# Patient Record
Sex: Female | Born: 1985 | Race: Black or African American | Hispanic: No | Marital: Married | Smoking: Never smoker
Health system: Southern US, Community
[De-identification: ages and names within clinical notes are randomized; demographics above are authoritative.]

## PROBLEM LIST (undated history)

## (undated) ENCOUNTER — Inpatient Hospital Stay (HOSPITAL_COMMUNITY): Payer: Self-pay

## (undated) DIAGNOSIS — Z789 Other specified health status: Secondary | ICD-10-CM

## (undated) HISTORY — PX: NO PAST SURGERIES: SHX2092

---

## 2017-12-23 ENCOUNTER — Encounter (HOSPITAL_COMMUNITY): Payer: Self-pay

## 2017-12-23 ENCOUNTER — Inpatient Hospital Stay (HOSPITAL_COMMUNITY)
Admission: AD | Admit: 2017-12-23 | Discharge: 2017-12-23 | Disposition: A | Discharge status: Home / Self Care | Payer: Medicaid Other | Attending: Obstetrics & Gynecology | Admitting: Obstetrics & Gynecology

## 2017-12-23 DIAGNOSIS — R109 Unspecified abdominal pain: Secondary | ICD-10-CM

## 2017-12-23 DIAGNOSIS — O2342 Unspecified infection of urinary tract in pregnancy, second trimester: Secondary | ICD-10-CM | POA: Diagnosis not present

## 2017-12-23 DIAGNOSIS — O0932 Supervision of pregnancy with insufficient antenatal care, second trimester: Secondary | ICD-10-CM

## 2017-12-23 DIAGNOSIS — R51 Headache: Secondary | ICD-10-CM

## 2017-12-23 DIAGNOSIS — Z3A22 22 weeks gestation of pregnancy: Secondary | ICD-10-CM

## 2017-12-23 DIAGNOSIS — R1030 Lower abdominal pain, unspecified: Secondary | ICD-10-CM | POA: Diagnosis present

## 2017-12-23 DIAGNOSIS — O26892 Other specified pregnancy related conditions, second trimester: Secondary | ICD-10-CM | POA: Diagnosis not present

## 2017-12-23 HISTORY — DX: Other specified health status: Z78.9

## 2017-12-23 LAB — WET PREP, GENITAL
Clue Cells Wet Prep HPF POC: NONE SEEN
Sperm: NONE SEEN
Trich, Wet Prep: NONE SEEN
Yeast Wet Prep HPF POC: NONE SEEN

## 2017-12-23 LAB — URINALYSIS, ROUTINE W REFLEX MICROSCOPIC
BILIRUBIN URINE: NEGATIVE
Bacteria, UA: NONE SEEN
Glucose, UA: NEGATIVE mg/dL
Hgb urine dipstick: NEGATIVE
Ketones, ur: NEGATIVE mg/dL
NITRITE: NEGATIVE
Protein, ur: NEGATIVE mg/dL
Specific Gravity, Urine: 1.013 (ref 1.005–1.030)
pH: 7 (ref 5.0–8.0)

## 2017-12-23 MED ORDER — CONCEPT OB 130-92.4-1 MG PO CAPS: 1.0000 | ORAL_CAPSULE | Freq: Every day | ORAL | 12 refills | Status: DC

## 2017-12-23 MED ORDER — IBUPROFEN 600 MG PO TABS
600.0000 mg | ORAL_TABLET | Freq: Once | ORAL | Status: AC
  Administered 2017-12-23: 600 mg via ORAL
  Filled 2017-12-23: qty 1

## 2017-12-23 MED ORDER — IBUPROFEN 600 MG PO TABS: 600.0000 mg | ORAL_TABLET | Freq: Four times a day (QID) | ORAL | 0 refills | Status: DC | PRN

## 2017-12-23 MED ORDER — NITROFURANTOIN MONOHYD MACRO 100 MG PO CAPS: 100.0000 mg | ORAL_CAPSULE | Freq: Two times a day (BID) | ORAL | 0 refills | Status: DC

## 2017-12-23 MED ORDER — NITROFURANTOIN MONOHYD MACRO 100 MG PO CAPS
100.0000 mg | ORAL_CAPSULE | Freq: Once | ORAL | Status: AC
  Administered 2017-12-23: 100 mg via ORAL
  Filled 2017-12-23: qty 1

## 2017-12-23 NOTE — MAU Note (Addendum)
Lower abdominal pain since ongoing for 3 weeks, intermittent, more when walking and standing, states it is a pressure, 7/10  No bleeding, no discharge today, no LOF  [facility] from [place]

## 2017-12-23 NOTE — Discharge Instructions (Signed)
Pregnancy and Urinary Tract Infection What is a urinary tract infection?  A urinary tract infection (UTI) is an infection of any part of the urinary tract. This includes the kidneys, the tubes that connect your kidneys to your bladder (ureters), the bladder, and the tube that carries urine out of your body (urethra). These organs make, store, and get rid of urine in the body.  An upper UTI affects the ureters and kidneys (pyelonephritis), and a lower UTI affects the bladder (cystitis) and urethra (urethritis). Most urinary tract infections are caused by bacteria in your genital area, around the entrance to your urinary tract (urethra). These bacteria grow and cause irritation and inflammation of your urinary tract. Why am I more likely to get a UTI during pregnancy? You are more likely to develop a UTI during pregnancy because:  The physical and hormonal changes your body goes through can make it easier for bacteria to get into your urinary tract.  Your growing baby puts pressure on your uterus and can affect urine flow. Does a UTI place my baby at risk? An untreated UTI during pregnancy could lead to a kidney infection, which can cause health problems that could affect your baby. Possible complications of an untreated UTI include:  Having your baby before 37 weeks of pregnancy (premature).  Having a baby with a low birth weight.  Developing high blood pressure during pregnancy (preeclampsia).  Having a low hemoglobin level (anemia). What are the symptoms of a UTI? Symptoms of a UTI include:  Needing to urinate right away (urgently).  Frequent urination or passing small amounts of urine frequently.  Pain or burning with urination.  Blood in the urine.  Urine that smells bad or unusual.  Trouble urinating.  Cloudy urine.  Pain in the abdomen or lower back.  Vaginal discharge. You may also have:  Vomiting or a decreased appetite.  Confusion.  Irritability or  tiredness.  A fever.  Diarrhea. What are the treatment options for a UTI during pregnancy? Treatment for this condition may include:  Antibiotic medicines that are safe to take during pregnancy.  Other medicines to treat less common causes of UTI. How can I prevent a UTI? To prevent a UTI:  Go to the bathroom as soon as you feel the need. Do not hold urine for long periods of time.  Always wipe from front to back after a bowel movement. Use each tissue one time when you wipe.  Empty your bladder after sex.  Keep your genital area dry.  Drink 6-10 glasses of water each day.  Do not douche or use deodorant sprays. Contact a health care provider if:  Your symptoms do not improve or they get worse.  You have abnormal vaginal discharge. Get help right away if:  You have a fever.  You have nausea and vomiting.  You have back or side pain.  You feel contractions in your uterus.  You have lower belly pain.  You have a gush of fluid from your vagina.  You have blood in your urine. Summary  A urinary tract infection (UTI) is an infection of any part of the urinary tract, which includes the kidneys, ureters, bladder, and urethra.  Most urinary tract infections are caused by bacteria in your genital area, around the entrance to your urinary tract (urethra).  You are more likely to develop a UTI during pregnancy.  If you were prescribed an antibiotic, take it as told by your health care provider. Do not stop taking the  antibiotic even if you start to feel better. °This information is not intended to replace advice given to you by your health care provider. Make sure you discuss any questions you have with your health care provider. °Document Released: 04/15/2010 Document Revised: 02/13/2017 Document Reviewed: 11/09/2014 °Elsevier Interactive Patient Education © 2019 Elsevier Inc. ° °Round Ligament Pain ° °The round ligament is a cord of muscle and tissue that helps support the  uterus. It can become a source of pain during pregnancy if it becomes stretched or twisted as the baby grows. The pain usually begins in the second trimester (13-28 weeks) of pregnancy, and it can come and go until the baby is delivered. It is not a serious problem, and it does not cause harm to the baby. °Round ligament pain is usually a short, sharp, and pinching pain, but it can also be a dull, lingering, and aching pain. The pain is felt in the lower side of the abdomen or in the groin. It usually starts deep in the groin and moves up to the outside of the hip area. The pain may occur when you: °· Suddenly change position, such as quickly going from a sitting to standing position. °· Roll over in bed. °· Cough or sneeze. °· Do physical activity. °Follow these instructions at home: ° °· Watch your condition for any changes. °· When the pain starts, relax. Then try any of these methods to help with the pain: °? Sitting down. °? Flexing your knees up to your abdomen. °? Lying on your side with one pillow under your abdomen and another pillow between your legs. °? Sitting in a warm bath for 15-20 minutes or until the pain goes away. °· Take over-the-counter and prescription medicines only as told by your health care provider. °· Move slowly when you sit down or stand up. °· Avoid long walks if they cause pain. °· Stop or reduce your physical activities if they cause pain. °· Keep all follow-up visits as told by your health care provider. This is important. °Contact a health care provider if: °· Your pain does not go away with treatment. °· You feel pain in your back that you did not have before. °· Your medicine is not helping. °Get help right away if: °· You have a fever or chills. °· You develop uterine contractions. °· You have vaginal bleeding. °· You have nausea or vomiting. °· You have diarrhea. °· You have pain when you urinate. °Summary °· Round ligament pain is felt in the lower abdomen or groin. It is  usually a short, sharp, and pinching pain. It can also be a dull, lingering, and aching pain. °· This pain usually begins in the second trimester (13-28 weeks). It occurs because the uterus is stretching with the growing baby, and it is not harmful to the baby. °· You may notice the pain when you suddenly change position, when you cough or sneeze, or during physical activity. °· Relaxing, flexing your knees to your abdomen, lying on one side, or taking a warm bath may help to get rid of the pain. °· Get help from your health care provider if the pain does not go away or if you have vaginal bleeding, nausea, vomiting, diarrhea, or painful urination. °This information is not intended to replace advice given to you by your health care provider. Make sure you discuss any questions you have with your health care provider. °Document Released: 09/28/2007 Document Revised: 06/06/2017 Document Reviewed: 06/06/2017 °Elsevier Interactive Patient Education ©   Education  2019 [employer].

## 2017-12-23 NOTE — MAU Provider Note (Signed)
Chief Complaint:  Abdominal Pain   First Provider Initiated Contact with Patient 12/23/17 1142     HPI: Amanda George is a 32 y.o. G3P2002 at [id number] who presents to maternity admissions reporting bilateral, sharp low abdominal pain x2-3 weeks that is worse upon standing or changing positions.  Also reports headache.  Has not tried anything for the symptoms.  Has not started prenatal care.  Moved from [place] and did not have prenatal care there.  Location: Bilateral low abdomen Quality: Sharp Severity: 7/10 in pain scale Duration: 2-3 weeks Context: [redacted] weeks gestation Timing: Intermittent Modifying factors: Worse with position changes Associated signs and symptoms: Negative for fever, chills, leaking of fluid, vaginal discharge, vaginal bleeding, GI complaints.  Positive for dysuria, urgency, frequency.  Negative for hematuria or flank pain. Good fetal movement.    Past Medical History:  Diagnosis Date  . Medical history non-contributory    OB History  Gravida Para Term Preterm AB Living  3 2 2     2   SAB TAB Ectopic Multiple Live Births               # Outcome Date GA Lbr Len/2nd Weight Sex Delivery Anes PTL Lv  3 Current           2 Term      Vag-Spont     1 Term      Vag-Spont      Past Surgical History:  Procedure Laterality Date  . NO PAST SURGERIES     History reviewed. No pertinent family history. Social History   Tobacco Use  . Smoking status: Never Smoker  . Smokeless tobacco: Never Used  Substance Use Topics  . Alcohol use: Not Currently  . Drug use: Never   No Known Allergies No medications prior to admission.    I have reviewed patient's Past Medical Hx, Surgical Hx, Family Hx, Social Hx, medications and allergies.   ROS:  Review of Systems  Constitutional: Negative for chills and fever.  Gastrointestinal: Positive for abdominal pain. Negative for abdominal distention, constipation, diarrhea, nausea and vomiting.  Genitourinary: Positive for dysuria,  frequency and urgency. Negative for flank pain, hematuria, vaginal bleeding and vaginal discharge.  Musculoskeletal: Negative for back pain.    Physical Exam   Patient Vitals for the past 24 hrs:  BP Temp Temp src Pulse Resp Weight  12/23/17 1113 117/70 98.4 F (36.9 C) Oral 99 18 56.5 kg   Constitutional: Well-developed, well-nourished female in no acute distress.  Cardiovascular: normal rate Respiratory: normal effort GI: Abd soft, non-tender, gravid appropriate for gestational age. Pos BS x 4 MS: Extremities nontender, no edema, normal ROM Neurologic: Alert and oriented x 4.  GU: Neg CVAT.  Pelvic: NEFG, physiologic discharge, no blood, cervix clean. No CMT  Dilation: Closed Effacement (%): Thick Cervical Position: Middle Station: Ballotable Presentation: Undeterminable Exam by:: Dorathy KinsmanVirginia Sapna Padron, CNM  FHT: 165 by Doppler   Labs: Results for orders placed or performed during the hospital encounter of 12/23/17 (from the past 24 hour(s))  Urinalysis, Routine w reflex microscopic     Status: Abnormal   Collection Time: 12/23/17 11:21 AM  Result Value Ref Range   Color, Urine YELLOW YELLOW   APPearance CLEAR CLEAR   Specific Gravity, Urine 1.013 1.005 - 1.030   pH 7.0 5.0 - 8.0   Glucose, UA NEGATIVE NEGATIVE mg/dL   Hgb urine dipstick NEGATIVE NEGATIVE   Bilirubin Urine NEGATIVE NEGATIVE   Ketones, ur NEGATIVE NEGATIVE mg/dL   Protein,  ur NEGATIVE NEGATIVE mg/dL   Nitrite NEGATIVE NEGATIVE   Leukocytes, UA MODERATE (A) NEGATIVE   RBC / HPF 0-5 0 - 5 RBC/hpf   WBC, UA 6-10 0 - 5 WBC/hpf   Bacteria, UA NONE SEEN NONE SEEN   Squamous Epithelial / LPF 6-10 0 - 5   Mucus PRESENT   Wet prep, genital     Status: Abnormal   Collection Time: 12/23/17 12:12 PM  Result Value Ref Range   Yeast Wet Prep HPF POC NONE SEEN NONE SEEN   Trich, Wet Prep NONE SEEN NONE SEEN   Clue Cells Wet Prep HPF POC NONE SEEN NONE SEEN   WBC, Wet Prep HPF POC MODERATE (A) NONE SEEN   Sperm NONE  SEEN     Imaging:  NA  MAU Course: Orders Placed This Encounter  Procedures  . Wet prep, genital  . Culture, OB Urine  . US MFM OB COMP + 14 WK  . Urinalysis, Routine w reflex microscopic  . Lab instructions  . Discharge patient   Meds ordered this encounter  Medications  . ibuprofen (ADVIL,MOTRIN) tablet 600 mg  . nitrofurantoin (macrocrystal-monohydrate) (MACROBID) capsule 100 mg  . Prenat w/o A Vit-FeFum-FePo-FA (CONCEPT OB) 130-92.4-1 MG CAPS    Sig: Take 1 tablet by mouth daily.    Dispense:  30 capsule    Refill:  12    Order Specific Question:   Supervising Provider    Answer:   Jaynie CollinsANYANWU, UGONNA A [[id number]]  . nitrofurantoin, macrocrystal-monohydrate, (MACROBID) 100 MG capsule    Sig: Take 1 capsule (100 mg total) by mouth 2 (two) times daily.    Dispense:  14 capsule    Refill:  0    Order Specific Question:   Supervising Provider    Answer:   Jaynie CollinsANYANWU, UGONNA A [[id number]]  . ibuprofen (ADVIL,MOTRIN) 600 MG tablet    Sig: Take 1 tablet (600 mg total) by mouth every 6 (six) hours as needed for moderate pain. Use sparingly.  Do not use after [redacted] weeks gestation.    Dispense:  30 tablet    Refill:  0    Order Specific Question:   Supervising Provider    Answer:   Jaynie CollinsANYANWU, UGONNA A [[id number]]    MDM: -Low abdominal pain likely due to UTI and round ligament pain.  No evidence of active preterm labor or other emergent condition.  Rx ibuprofen and Macrobid.  Instructed to use ibuprofen sparingly and not use after [redacted] weeks gestation. -No prenatal care.  [facility]  In basket message sent.  Anatomy ultrasound ordered.  Assessment: 1. UTI (urinary tract infection) during pregnancy, second trimester   2. Abdominal pain during pregnancy in second trimester   3. No prenatal care in current pregnancy in second trimester   4. Headache in pregnancy, antepartum, second trimester     Plan: Discharge home in stable condition.   Preterm Labor precautions and fetal kick counts GC/Chlamydia pending Follow-up Information    [facility] Follow up.   Specialty:  Radiology Why:  Will call you to schedule ultrasound Contact information: [address] [id number] mc [place] [id number] [phone]       [facility].   Specialty:  Obstetrics and Gynecology Why:  Call you to schedule appointment.  Please call if you have not received a call in 1 week. Contact information: [address] [address] [phone]       [facility] Follow up.   Specialty:  Obstetrics and Gynecology Why:  As needed in pregnancy emergencies Contact information: [address] [id number] mc [place] [address] [phone]          Allergies as of 12/23/2017   No Known Allergies     Medication List    TAKE these medications   CONCEPT OB 130-92.4-1 MG Caps Take 1 tablet by mouth daily.   ibuprofen 600 MG tablet Commonly known as:  ADVIL,MOTRIN Take 1 tablet (600 mg total) by mouth every 6 (six) hours as needed for moderate pain. Use sparingly.  Do not use after [redacted] weeks gestation.   nitrofurantoin (macrocrystal-monohydrate) 100 MG capsule Commonly known as:  MACROBID Take 1 capsule (100 mg total) by mouth 2 (two) times daily.       Katrinka Blazing, [place], CNM 12/23/2017 1:02 PM

## 2017-12-24 LAB — CULTURE, OB URINE: Culture: 10000 — AB

## 2017-12-24 LAB — GC/CHLAMYDIA PROBE AMP (~~LOC~~) NOT AT ARMC
Chlamydia: NEGATIVE
Neisseria Gonorrhea: NEGATIVE

## 2018-01-02 NOTE — L&D Delivery Note (Signed)
Delivery Note Amanda George progressed spontaneously to completely dilated and crowning at 1300 with a BBOW present at introitus which was easily ruptured manually for thin MSF. She pushed with the next couple of contractions and at 1:08 PM a viable female was delivered via Vaginal, Spontaneous (Presentation: ROA). Cord was looped next to the neck, but not around it.  APGAR: 9, 9; weight: 2940gm.   Placenta status: spont ,intact .  Cord: 3 vessel  Anesthesia:  Epidural Episiotomy: Type III infibulation; hood cut upwards with delivery to facilitate room for head while attempting to avoid perineal trauma;  Lacerations: None Suture Repair: 4.0 monocryl on an SH needle- repair of infibulation to previous status per pt request Est. Blood Loss (mL): 56  Mom to postpartum.  Baby to Couplet care / Skin to Skin.  [name] CNM 04/12/2018, 1:38 PM  Please schedule this patient for Postpartum visit in: 4 weeks with the following provider: Any provider For C/S patients schedule nurse incision check in weeks 2 weeks: no Low risk pregnancy complicated by: none Delivery mode:  SVD Anticipated Birth Control:  IUD PP Procedures needed: none  Schedule Integrated BH visit: no

## 2018-02-01 ENCOUNTER — Encounter: Payer: Self-pay | Admitting: *Deleted

## 2018-02-01 ENCOUNTER — Other Ambulatory Visit: Payer: Self-pay

## 2018-02-01 ENCOUNTER — Ambulatory Visit: Payer: Medicaid Other | Admitting: Clinical

## 2018-02-01 ENCOUNTER — Ambulatory Visit (INDEPENDENT_AMBULATORY_CARE_PROVIDER_SITE_OTHER): Payer: Medicaid Other | Admitting: *Deleted

## 2018-02-01 VITALS — BP 103/57 | HR 93 | Wt 128.2 lb

## 2018-02-01 DIAGNOSIS — Z3490 Encounter for supervision of normal pregnancy, unspecified, unspecified trimester: Secondary | ICD-10-CM | POA: Insufficient documentation

## 2018-02-01 DIAGNOSIS — O0930 Supervision of pregnancy with insufficient antenatal care, unspecified trimester: Secondary | ICD-10-CM

## 2018-02-01 DIAGNOSIS — Z789 Other specified health status: Secondary | ICD-10-CM

## 2018-02-01 NOTE — BH Specialist Note (Signed)
Integrated Behavioral Health Initial Visit  MRN: [id number] Name: Aveona Greenlief  Number of Integrated Behavioral Health Clinician visits:: 1/6 Session Start time: 10:14  Session End time: 10:24 Total time: 15 minutes  Type of Service: Integrated Behavioral Health- Individual/Family Interpretor:Yes.   Interpretor Name and Language: Arabic   Warm Hand Off Completed.       SUBJECTIVE: Noeline Pontrelli is a 33 y.o. female accompanied by n/a Patient was referred by [place] Muta Day, RN for Initial OB introduction to integrated behavioral health services . Patient reports the following symptoms/concerns: Pt states her only concern today is being unfamiliar with the new [facility] Duration of problem: Current pregnancy; Severity of problem: mild  OBJECTIVE: Mood: Anxious and Affect: Appropriate Risk of harm to self or others: No plan to harm self or others  LIFE CONTEXT: Family and Social: - School/Work: - Self-Care: - Life Changes: Current pregnancy  GOALS ADDRESSED: Patient will: 1. Reduce symptoms of: stress, related to upcoming hospital move  INTERVENTIONS: Interventions utilized: Supportive Counseling  Standardized Assessments completed: not given today  ASSESSMENT: Patient currently experiencing Supervision of low-risk pregnancy, antepartum   Patient may benefit from Initial OB introduction to integrated behavioral health services .  PLAN: 1. Follow up with behavioral health clinician on : As needed 2. Behavioral recommendations:  -Share new hospital address with husband; become familiar with new hospital location -Continue taking prenatal vitamin, as recommended by medical provider 3. Referral(s): Integrated [employer] (In Clinic) 4. "From scale of 1-10, how likely are you to follow plan?": 10  Avriana Joo C Carissa Musick, LCSW

## 2018-02-01 NOTE — Progress Notes (Signed)
Video interpreters Noha [id number] and Christen Bame [phone] used for encounter. Multiple issues with [employer] system  were encountered during pt's interview which caused several delays with completing the visit. New Ob intake completed and pregnancy information packet given. Labs drawn, [place] ordered and scheduled. Pt reports having intermittent abdominal pain x4 days which feels like labor. She denies having any pain today. Pt also states that she has noticed her lips are swollen, dry and cracked for the past 2 days. She has been using vaseline without improvement. She denies food allergies and has not eaten anything out of the ordinary. Pt was advised of when she should come to hospital for possible labor symptoms. She was also advised to stay well hydrated and she may try Blistex ointment to lips. New Ob provider appointment is scheduled on 02/15/18.

## 2018-02-04 ENCOUNTER — Encounter (HOSPITAL_COMMUNITY): Payer: Self-pay

## 2018-02-11 ENCOUNTER — Ambulatory Visit (HOSPITAL_COMMUNITY)
Admission: RE | Admit: 2018-02-11 | Discharge: 2018-02-11 | Disposition: A | Discharge status: Home / Self Care | Payer: Medicaid Other | Source: Ambulatory Visit | Attending: Advanced Practice Midwife | Admitting: Advanced Practice Midwife

## 2018-02-11 DIAGNOSIS — O26892 Other specified pregnancy related conditions, second trimester: Secondary | ICD-10-CM | POA: Insufficient documentation

## 2018-02-11 DIAGNOSIS — O0933 Supervision of pregnancy with insufficient antenatal care, third trimester: Secondary | ICD-10-CM | POA: Diagnosis not present

## 2018-02-11 DIAGNOSIS — R109 Unspecified abdominal pain: Secondary | ICD-10-CM | POA: Insufficient documentation

## 2018-02-11 DIAGNOSIS — O0932 Supervision of pregnancy with insufficient antenatal care, second trimester: Secondary | ICD-10-CM | POA: Diagnosis not present

## 2018-02-11 DIAGNOSIS — R51 Headache: Secondary | ICD-10-CM | POA: Diagnosis not present

## 2018-02-11 DIAGNOSIS — O2342 Unspecified infection of urinary tract in pregnancy, second trimester: Secondary | ICD-10-CM | POA: Diagnosis not present

## 2018-02-11 DIAGNOSIS — Z3A30 30 weeks gestation of pregnancy: Secondary | ICD-10-CM | POA: Diagnosis not present

## 2018-02-11 DIAGNOSIS — Z363 Encounter for antenatal screening for malformations: Secondary | ICD-10-CM

## 2018-02-12 LAB — OBSTETRIC PANEL, INCLUDING HIV
Antibody Screen: NEGATIVE
BASOS ABS: 0.1 10*3/uL (ref 0.0–0.2)
Basos: 1 %
EOS (ABSOLUTE): 0.2 10*3/uL (ref 0.0–0.4)
Eos: 3 %
HEMOGLOBIN: 10.2 g/dL — AB (ref 11.1–15.9)
HIV Screen 4th Generation wRfx: NONREACTIVE
Hematocrit: 30.1 % — ABNORMAL LOW (ref 34.0–46.6)
Hepatitis B Surface Ag: NEGATIVE
IMMATURE GRANULOCYTES: 0 %
Immature Grans (Abs): 0 10*3/uL (ref 0.0–0.1)
Lymphocytes Absolute: 1.5 10*3/uL (ref 0.7–3.1)
Lymphs: 29 %
MCH: 31.5 pg (ref 26.6–33.0)
MCHC: 33.9 g/dL (ref 31.5–35.7)
MCV: 93 fL (ref 79–97)
MONOCYTES: 6 %
Monocytes Absolute: 0.3 10*3/uL (ref 0.1–0.9)
Neutrophils Absolute: 3.2 10*3/uL (ref 1.4–7.0)
Neutrophils: 61 %
PLATELETS: 119 10*3/uL — AB (ref 150–450)
RBC: 3.24 x10E6/uL — AB (ref 3.77–5.28)
RDW: 12.4 % (ref 11.7–15.4)
RPR Ser Ql: NONREACTIVE
Rh Factor: POSITIVE
Rubella Antibodies, IGG: 7.67 index (ref >0.99)
WBC: 5.3 10*3/uL (ref 3.4–10.8)

## 2018-02-12 LAB — INHERITEST(R) CF/SMA PANEL

## 2018-02-12 LAB — HEMOGLOBINOPATHY EVALUATION
Ferritin: 21 ng/mL (ref 15–150)
HGB SOLUBILITY: NEGATIVE
Hgb A2 Quant: 2.4 % (ref 1.8–3.2)
Hgb A: 97.6 % (ref 96.4–98.8)
Hgb C: 0 %
Hgb F Quant: 0 % (ref 0.0–2.0)
Hgb S: 0 %
Hgb Variant: 0 %

## 2018-02-15 ENCOUNTER — Ambulatory Visit (INDEPENDENT_AMBULATORY_CARE_PROVIDER_SITE_OTHER): Payer: Medicaid Other | Admitting: Family Medicine

## 2018-02-15 ENCOUNTER — Encounter: Payer: Self-pay | Admitting: Family Medicine

## 2018-02-15 ENCOUNTER — Other Ambulatory Visit (HOSPITAL_COMMUNITY)
Admission: RE | Admit: 2018-02-15 | Discharge: 2018-02-15 | Disposition: A | Discharge status: Home / Self Care | Payer: Medicaid Other | Source: Ambulatory Visit | Attending: Family Medicine | Admitting: Family Medicine

## 2018-02-15 VITALS — BP 104/61 | HR 92 | Wt 129.5 lb

## 2018-02-15 DIAGNOSIS — Z3A30 30 weeks gestation of pregnancy: Secondary | ICD-10-CM | POA: Diagnosis not present

## 2018-02-15 DIAGNOSIS — Z789 Other specified health status: Secondary | ICD-10-CM

## 2018-02-15 DIAGNOSIS — Z23 Encounter for immunization: Secondary | ICD-10-CM | POA: Diagnosis not present

## 2018-02-15 DIAGNOSIS — Z3493 Encounter for supervision of normal pregnancy, unspecified, third trimester: Secondary | ICD-10-CM | POA: Diagnosis not present

## 2018-02-15 DIAGNOSIS — N90810 Female genital mutilation status, unspecified: Secondary | ICD-10-CM | POA: Insufficient documentation

## 2018-02-15 NOTE — Progress Notes (Signed)
*In-person interpreter used for visit* Subjective:   Sanskriti Kadel is a 33 y.o. G3P2002 at [redacted]w[redacted]d by LMP being seen today for her first obstetrical visit.  Her obstetrical history is significant for late prenatal care. Patient does intend to breast feed. Pregnancy history fully reviewed.  - prior children full-term, both vaginal, born at home - Pap due, doesn't think she has ever had one - glucose testing, had in other pregnancies and never abnormal   Patient reports no complaints.  HISTORY: OB History  Gravida Para Term Preterm AB Living  3 2 2  0 0 2  SAB TAB Ectopic Multiple Live Births  0 0 0 0 2    # Outcome Date GA Lbr Len/2nd Weight Sex Delivery Anes PTL Lv  3 Current           2 Term 04/15/15 [redacted]w[redacted]d   F Vag-Spont  N LIV     Birth Comments: Nuchal and body cord - delayed breathing and crying  1 Term 03/23/12 [redacted]w[redacted]d   M Vag-Spont  N LIV     Birth Comments: Nuchal cord @ birth, delayed breathing and crying     Name: Shirline Frees     Past Medical History:  Diagnosis Date  . Medical history non-contributory    Past Surgical History:  Procedure Laterality Date  . NO PAST SURGERIES     Family History  Problem Relation Age of Onset  . Healthy Mother   . Healthy Father    Social History   Tobacco Use  . Smoking status: Never Smoker  . Smokeless tobacco: Never Used  Substance Use Topics  . Alcohol use: Not Currently  . Drug use: Never   No Known Allergies Current Outpatient Medications on File Prior to Visit  Medication Sig Dispense Refill  . Prenat w/o A Vit-FeFum-FePo-FA (CONCEPT OB) 130-92.4-1 MG CAPS Take 1 tablet by mouth daily. 30 capsule 12   No current facility-administered medications on file prior to visit.     Exam   Vitals:   02/15/18 0840  BP: 104/61  Pulse: 92  Weight: 58.7 kg   Fetal Heart Rate (bpm): 147  Uterus:     Pelvic Exam: Perineum: no hemorrhoids, normal perineum   Vulva: No lesions, partial fusion of external labia anteriorly   Vagina:  normal mucosa, normal discharge   Cervix: no lesions and normal, pap smear done.    Adnexa: normal adnexa and no mass, fullness, tenderness   Bony Pelvis: average  System: General: well-developed, well-nourished female in no acute distress   Skin: normal coloration and turgor, no rashes   Neurologic: oriented, normal, negative, normal mood   Extremities: normal strength, tone, and muscle mass, ROM of all joints is normal   HEENT PERRL, extraocular movement intact and sclera clear, anicteric   Mouth/Teeth mucous membranes moist, pharynx normal without lesions and dental hygiene good   Neck supple and no masses   Cardiovascular: regular rate and rhythm   Respiratory:  no respiratory distress, normal breath sounds   Abdomen: soft, non-tender; bowel sounds normal; no masses,  no organomegaly     Assessment:   Pregnancy: [id number] Patient Active Problem List   Diagnosis Date Noted  . 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 Supervision of low-risk pregnancy 02/01/2018  . Late prenatal care 02/01/2018  . Language barrier 02/01/2018     Plan:  33yo [id number] at [redacted]w[redacted]d by LMP who presents for first prenatal visit.   Initial Prenatal Visit -- labs reviewed (drawn at  nurse intake) -- anatomy U/S reviewed (wnl) - will follow-up again in 4 weeks for interval growth and to better visualize some anatomic landmarks -- will return next week for 2-hr GTT -- continue prenatal vitamins  -- flu and Tdap vaccines to be given today  -- Problem list reviewed and updated. Pregnancy history reviewed.  -- Pap smear collected  -- The nature of [facility] with multiple MDs and other Advanced Practice Providers was explained to patient; also emphasized that residents, students are part of our team. -- Routine obstetric precautions reviewed.  Follow-Up: 2 weeks  Cyntha Brickman S. Earlene Plater, DO OB/GYN Fellow

## 2018-02-18 DIAGNOSIS — Z3492 Encounter for supervision of normal pregnancy, unspecified, second trimester: Secondary | ICD-10-CM

## 2018-02-18 LAB — GLUCOSE, POCT (MANUAL RESULT ENTRY): POC Glucose: 83 mg/dl (ref 70–99)

## 2018-02-18 NOTE — Progress Notes (Signed)
Medicaid Home Form Completed-02-18-18

## 2018-02-18 NOTE — Congregational Nurse Program (Signed)
Amanda George came in for blood pressure check. She has previously been diagnosed with low blood pressure during pregnancy by her OBGYN. Health education provided regarding low blood pressure, to drink enough water and to eat small frequent meal.She will be coming frequently for BP checks.

## 2018-02-19 LAB — CYTOLOGY - PAP
Diagnosis: NEGATIVE
HPV: NOT DETECTED

## 2018-03-05 ENCOUNTER — Ambulatory Visit (INDEPENDENT_AMBULATORY_CARE_PROVIDER_SITE_OTHER): Payer: Medicaid Other | Admitting: Student

## 2018-03-05 ENCOUNTER — Other Ambulatory Visit: Payer: Medicaid Other

## 2018-03-05 DIAGNOSIS — Z3A33 33 weeks gestation of pregnancy: Secondary | ICD-10-CM | POA: Diagnosis not present

## 2018-03-05 DIAGNOSIS — Z3492 Encounter for supervision of normal pregnancy, unspecified, second trimester: Secondary | ICD-10-CM

## 2018-03-05 DIAGNOSIS — Z3493 Encounter for supervision of normal pregnancy, unspecified, third trimester: Secondary | ICD-10-CM | POA: Diagnosis not present

## 2018-03-05 NOTE — Patient Instructions (Signed)
AREA PEDIATRIC/FAMILY PRACTICE PHYSICIANS  [place] ([address]) .  [facility] o Chambliss, MD; Eniola, MD; Hale, MD; Hensel, MD; McDiarmid, MD; McIntyer, MD; Neal, MD; Walden, MD o [address] [place], [place] [address] o [phone] o Mon-Fri 8:30-12:30, 1:30-5:00 o Providers come to see babies at [facility] o Accepting Medicaid . [facility] o Limited providers who accept newborns: Koirala, MD; Morrow, MD; Wolters, MD o [address], [place], [place] [address] o [phone] o Mon-Fri 8:00-5:30 o Babies seen by providers at [facility] o Does NOT accept Medicaid o Please call early in hospitalization for appointment (limited availability)  . [facility] o Mulberry, MD o [address] [place], [place] [address] o [phone] o Mon, Tue, Thur, Fri 8:30-5:00, Wed 10:00-7:00 (closed 1-2pm) o Babies seen by [facility] providers o Accepting Medicaid . [facility] - Pediatrician o Rubin, MD o [address], [place], [place] [address] o [phone] o Mon-Fri 8:30-5:00, Sat 8:30-12:00 o Provider comes to see babies at [facility] o Accepting Medicaid o Must have been referred from current patients or contacted office prior to delivery . [facility] ([facility]) o Brown, MD; Chandler, MD; Ettefagh, MD; Grant, MD; Lester, MD; McCormick, MD; McQueen, MD; Prose, MD; Simha, MD; Stanley, MD; Stryffeler, NP; Tebben, NP o [address], [place], [place] [address] o [phone] o Mon, Tue, Thur, Fri 8:30-5:30, Wed 9:30-5:30, Sat 8:30-12:30 o Babies seen by [facility] providers o Accepting Medicaid o Only accepting infants of first-time parents or siblings of current patients o Hospital discharge coordinator will make follow-up appointment . Jack Amos o [address],  [place], [place]  [address] o [phone]   Fax - [phone] . [facility] o [address], [place], [place]  [address] o Phone - [phone]   Fax - [phone] . Shilpa Gosrani o [address], [place], [place]  [address] o [phone]  [place] ([address]) . [facility] o Bates, MD; [place]assfield, MD; Cooper, Cox, MD; MD; Davis, MD; Dovico, MD; Ettefaugh, MD; Little, MD; Lowe, MD; Keiffer, MD; Melvin, MD; Sumner, MD; Williams, MD o [address], [place], [place] [address] o [phone] o Mon-Fri 8:30-5:00 (extended evenings Mon-Thur as needed), Sat-Sun 10:00-1:00 o Providers come to see babies at [facility] o Accepting Medicaid for families of first-time babies and families with all children in the household age 3 and under. Must register with office prior to making appointment (M-F only). . [facility] o Henson, NP; Knapp, MD; Lalonde, MD; Tysinger, PA o [address] [place],  [address] o [phone] o Mon-Fri 8:00-5:00 o Babies seen by providers at [facility] o Does NOT accept Medicaid/Commercial Insurance Only . [facility] ([facility])  o Artis, MD; Barnes, MD; Bratton, MD; Coccaro, MD; Lockett Gardner, MD; Kramer, MD; Marshall, MD; Netherton, MD; Poleto, MD; Skinner, MD o [address] [place],  [address] o [phone] o Mon-Fri 8:30-5:30, Sat (Oct.-Mar.) 9:00-1:00 o Babies seen by providers at [facility] o Accepting Medicaid  [place] ([address]) . [facility]  o Reid, MD; Warner, MD o [address], ,  [address] o [phone] o Mon-Fri 8:30-5:00, Sat 8:30-12:00 o Providers come to see babies at [facility] o Does NOT accept Medicaid . [facility] o Becker, PA; Hagler, MD; Scifres, PA; Sun, MD; Swayne, MD o [address],  [place], [place] [address] o [phone] o Mon-Fri 8:00-5:00 o Babies seen by providers at [facility] o Does NOT accept Medicaid o Only accepting babies of parents who are patients o Please call early in hospitalization for appointment (limited availability) . [facility] o Clark, MD; Frye, MD; Kelleher, MD; Mack, NP; Miller, MD; O'Keller, MD; Patterson, NP; Pudlo, MD; Puzio, MD; Thomas, MD; Tucker, MD; Twiselton, MD o [address], [place], [place] [address] o [phone] o Mon-Fri 8:00-5:00, Sat 9:00-12:00 o Providers come to see babies at [facility] o Does NOT accept Medicaid  [place] ([address]) . [facility] o Limited providers accepting new patients: Brake, NP; Wharton, PA o [address], [place], [place] [address] o [phone] o Mon-Fri 8:00-5:00 o Babies seen by providers at [facility] o Does NOT accept Medicaid o Only accepting babies of parents who are patients o Please call early in hospitalization for appointment (limited availability) . [facility] o Gay, MD; Quinlan, MD o [address] [place], [place] [address] o [phone] (press 1 to schedule appointment) o Mon-Fri 8:00-5:00 o Providers come to see babies at [facility] o Does NOT accept Medicaid . [facility] o Mazer, MD o [address] [place], [place] [address] o [phone] o Mon-Fri 8:30-5:00 (lunch 12:30-1:00), extended hours by appointment only Wed 5:00-6:30 o Babies seen by [facility] providers o Accepting Medicaid . [facility] o Banks, MD; Jordan, MD; Koberlein, MD o [address], [place], [place] [address] o [phone] o Mon-Fri 8:00-5:00 o Babies seen by [facility] providers o Does NOT accept Medicaid . [facility] o Parker, MD; Hunter, MD; Wallace, DO o [address] [place], [place]  [address] o [phone] o Mon-Fri 8:00-5:00 o Babies seen by [facility] providers o Does NOT accept Medicaid . [facility] o Brandon, PA; Brecken, PA; Christy, NP; Dees, MD; DeClaire, MD; DeWeese, MD; Hansen, NP; Mills, NP; Parrish, NP; Smoot, NP; Summer, MD; Vapne, MD o [address] [place], [place] [address] o [phone] o Mon-Fri 8:30-5:00, Sat 10:00-1:00 o Providers come to see babies at [facility] o Does NOT accept Medicaid o Free prenatal information session Tuesdays at 4:45pm . [facility] o Bouska, MD; Gordon, PA; Jeffery, PA; Weber, PA o [address] [place] [address] o [phone] o Mon-Fri 7:30-5:30 o Babies seen by [facility] providers . [facility] o [address], [place], [place]  [address] o [phone]   Fax - [phone]  [place] ([address] & [address]) . [facility] o [place], [place] o [address] [place], [place] [address] o [phone] o Mon-Thur 8:00-6:00 o Providers come to see babies at [facility] o Accepting Medicaid . [facility] o Anderson, NP; Badger, MD; Beal, PA; Spencer, PA o [address] [place], [place] [address] o [phone] o Mon-Thur 7:30-7:30, Fri 7:30-4:30 o Babies seen by [facility] providers o Accepting Medicaid . [facility] o Agbuya, MD; Klett, NP; Romgoolam, MD o [address], [place], [place] [address] o [phone] o Mon-Fri 8:30-5:00, Sat 8:30-12:00 o Providers come to see babies at [facility] o Accepting Medicaid o Must have "Meet & Greet" appointment at office prior to delivery . [facility] ([facility] o McCord,   MD; Wallace, MD; Wood, MD o [address], [place], [place] [address] o [phone] o Mon-Wed 8:00-6:00, Thur-Fri 8:00-5:00, Sat 9:00-12:00 o Providers come to  see babies at [facility] o Does NOT accept Medicaid o Only accepting siblings of current patients . [facility]  o [address], [place], [place]  [address] o [phone]   Fax - [phone] . [facility] o [address], [place], [place]  [address] o [phone]   Fax - [phone]  [place] ([address] & [address]) . [facility] o Cirigliano, DO; Matthews, DO o [address] [place], [place] [address] o [phone] o Mon-Fri 7:00-5:00 o Babies seen by [facility] providers o Does NOT accept Medicaid . [facility] o Briscoe, MD; Howley, PA; Moreira, PA o [address], [place], [place] [address] o [phone] o Mon-Fri 8:00-5:00 o Babies seen by [facility] providers o Accepting Medicaid . [facility] o Boyd, MD; Church, PA; Jones, NP; Osborn, PA o [address], [place], [place] [address] o [phone] o Mon-Fri 8:00-5:00 o Babies seen by providers at [facility] o Accepting Medicaid  [place] ([address]) . [facility] [place] o Wendling, DO o [address] [place], [place] [address] o [phone] o Mon-Fri 8:00-5:00 o Babies seen by [facility] providers o Does NOT accept Medicaid o Limited availability, please call early in hospitalization to schedule follow-up . [facility] o Calderon, PA; Cummings, MD; Dillard, MD; Martin, PA; Olson, MD; VanDeven, PA o [address], [place], [place] [address] o [phone] o Mon-Fri 8:30-5:00, Sat 9:00-12:00 o Babies seen by providers at [facility] o Accepting Medicaid o Please register online then schedule online or call office o [email or web address] . [facility] ([facility] o Hunter, NP; Kumar, MD; Martin Rogers, PA o [address], [place], [place] [address] o [phone] o Mon-Fri 8:00-5:00 o Babies seen by providers at [facility] o Accepting Medicaid . [facility] ([facility]) o [place], MD; Kristi Fleenor, NP; West, MD o [address], [place], [place] [address] o [phone] o Mon-Fri 8:00-5:30, Sat&Sun by appointment (phones open at 8:30) o Babies seen by [facility] providers o Accepting Medicaid o Must be a first-time baby or sibling of current patient . [facility] [place]  o [address], [place], [place]  [address] o [phone]   Fax - [phone]  [place] ([address]) . [facility] o Brown, PA; Cowen, PA; Rice, MD; Helton, PA; Spry, MD o [address] [place], [place] [address] o [phone] o Mon-Thur 8:00-7:00, Fri 8:00-5:00, Sat 8:00-12:00, Sun 9:00-12:00 o Babies seen by [facility] providers o Accepting Medicaid . [facility] [place] o Coe-Goins, MD; Marshall, MD; Pierre-Louis, MD o [address] [place] [address], [place], [place] [address] o [phone] o Mon-Thur 8:00-5:00 o Babies seen by providers at [facility] o Accepting Medicaid . [facility] [place] o Bratton, MD; Coe-Goins, MD; Hayes, MD; Lewis, MD; List, MD; Lott, MD; Marshall, MD; Moran, MD; O'Neal, MD; Pierre-Louis, MD; Pitonzo, MD; Scholer, MD; Spangle, MD o [address] [place] [address] [place],    [address] o [phone] o Mon-Fri 8:00-5:30, Sat (Oct.-Mar.) 9:00-1:00 o Babies seen by providers at [facility] o Accepting Medicaid o Must fill out new patient packet, available online at [email or web address] . [facility] ([facility]) o Friddle, NP; Harris, NP; Kelly, NP; Logan, MD;  Melvin, PA; Poth, MD; Ramadoss, MD; Stanton, NP o [address], [place], [place] [address] o [phone] o Mon-Thur 8:00-5:30, Fri 8:00-5:00 o Babies seen by providers at [facility] o Accepting Medicaid  [place] ([address]) . [facility] o Dixon, PA; Monument, MD; Pickard, MD; Tapia, PA o [address], [place], [place] [address] o [phone] o Mon-Fri 8:00-5:00 o Babies seen by providers at [facility] o Accepting Medicaid   [place] ([address]) . [facility] [place] o Masneri, DO; Meyers, MD; Nelson, PA o [address], [place], [place] [address] o [phone] o Mon-Fri 8:00-5:00 o Babies seen by providers at [facility] o Does NOT accept Medicaid o Limited appointment availability, please call early in hospitalization  . [facility] [place] o Kunedd, DO; McGowen, MD o [address], [place], [place] [address] o [phone] o Mon-Fri 8:00-5:00 o Babies seen by [facility] providers o Does NOT accept Medicaid . [facility] [place] o Cameron, MD; MacDonald, MD; Michaels, PA; Nayak, MD o [address] [place] [address], [place], [place] [address] o [phone] o Mon-Fri 8:00-5:00 o After hours clinic ([address] [place], [place] [address]) [phone] Mon-Fri 5:00-8:00, Sat 12:00-6:00, Sun 10:00-4:00 o Babies seen by [facility] providers o Accepting Medicaid . [facility] [place] o [address], [place], [place]  [address] o [phone]   Fax - [phone]  [place] ([address]) . [facility] [place] o Andy, MD o [address], [place], [place] [address] o [phone] o Mon-Fri 8:00-5:00 o Babies seen by [facility] providers o Does NOT accept Medicaid . [facility] [place] ([facility] [place]) o Eksir, MD o [address], [place], [place]  [address] o [phone] o Mon-Thur 8:00-7:00, Fri 8:00-5:00, Sat 8:00-12:00 o Babies seen by providers at [facility] o Accepting Medicaid - but does not have vaccinations in office (must be received elsewhere) o Limited availability, please call early in hospitalization  [place] ([address]) . [facility]  o Charlene Flemming, MD o [address],   [address] o [phone]  Fax [phone]   

## 2018-03-05 NOTE — Progress Notes (Signed)
   PRENATAL VISIT NOTE  Subjective:  Amanda George is a 33 y.o. G3P2002 at [redacted]w[redacted]d being seen today for ongoing prenatal care.  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  Patient reports no complaints.   . Vag. Bleeding: None.  Movement: Present. Denies leaking of fluid.   The following portions of the patient's history were reviewed and updated as appropriate: allergies, current medications, past family history, past medical history, past social history, past surgical history and problem list. Problem list updated.  Objective:   Vitals:   03/05/18 0849  BP: (!) 86/64  Pulse: 84  Weight: 134 lb (60.8 kg)    Fetal Status: Fetal Heart Rate (bpm): 155 Fundal Height: 33 cm Movement: Present     General:  Alert, oriented and cooperative. Patient is in no acute distress.  Skin: Skin is warm and dry. No rash noted.   Cardiovascular: Normal heart rate noted  Respiratory: Normal respiratory effort, no problems with respiration noted  Abdomen: Soft, gravid, appropriate for gestational age.  Pain/Pressure: Present     Pelvic: Cervical exam deferred        Extremities: Normal range of motion.  Edema: None  Mental Status: Normal mood and affect. Normal behavior. Normal judgment and thought content.   Assessment and Plan:  Pregnancy: G3P2002 at [redacted]w[redacted]d  1. Encounter for supervision of low-risk pregnancy in second trimester -patient will look up the name of her other children's pediatrician and bring it; new list given.  -she is strongly considering IUD -2 hour gtt  Preterm labor symptoms and general obstetric precautions including but not limited to vaginal bleeding, contractions, leaking of fluid and fetal movement were reviewed in detail with the patient. Please refer to After Visit Summary for other counseling  recommendations.  No follow-ups on file.  Future Appointments  Date Time Provider Department Center  03/05/2018  9:35 AM Crisoforo [place], Charlesetta Garibaldi, CNM [facility]    Charlesetta Garibaldi [place], [place]

## 2018-03-06 LAB — GLUCOSE TOLERANCE, 2 HOURS W/ 1HR
GLUCOSE, 2 HOUR: 61 mg/dL — AB (ref 65–152)
Glucose, 1 hour: 99 mg/dL (ref 65–179)
Glucose, Fasting: 66 mg/dL (ref 65–91)

## 2018-03-15 ENCOUNTER — Telehealth: Payer: Self-pay | Admitting: Family Medicine

## 2018-03-15 NOTE — Telephone Encounter (Signed)
Called the patient to inform of change in appointment time. Left a voicemail message stating to call our office and also sending a reminder letter.

## 2018-03-18 ENCOUNTER — Telehealth: Payer: Self-pay | Admitting: Family Medicine

## 2018-03-18 NOTE — Telephone Encounter (Signed)
Called patient with arabic interpreter ID # [id number] to inform about the restrictions at the office due to the coronavirus. Patient verbalized understanding.

## 2018-03-19 ENCOUNTER — Other Ambulatory Visit: Payer: Self-pay

## 2018-03-19 ENCOUNTER — Ambulatory Visit (INDEPENDENT_AMBULATORY_CARE_PROVIDER_SITE_OTHER): Payer: Medicaid Other | Admitting: Student

## 2018-03-19 ENCOUNTER — Encounter: Payer: Self-pay | Admitting: Student

## 2018-03-19 VITALS — BP 110/75 | HR 80 | Wt 132.0 lb

## 2018-03-19 DIAGNOSIS — Z3A35 35 weeks gestation of pregnancy: Secondary | ICD-10-CM

## 2018-03-19 DIAGNOSIS — O26843 Uterine size-date discrepancy, third trimester: Secondary | ICD-10-CM

## 2018-03-19 DIAGNOSIS — Z3493 Encounter for supervision of normal pregnancy, unspecified, third trimester: Secondary | ICD-10-CM

## 2018-03-19 NOTE — Progress Notes (Signed)
   PRENATAL VISIT NOTE  Subjective:  Amanda George is a 33 y.o. G3P2002 at [redacted]w[redacted]d being seen today for ongoing prenatal care.  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  Patient reports no complaints.  Contractions: Irritability. Vag. Bleeding: None.  Movement: Present. Denies leaking of fluid.   The following portions of the patient's history were reviewed and updated as appropriate: allergies, current medications, past family history, past medical history, past social history, past surgical history and problem list.   Objective:   Vitals:   03/19/18 1348  BP: 110/75  Pulse: 80  Weight: 132 lb (59.9 kg)    Fetal Status: Fetal Heart Rate (bpm): 138 Fundal Height: 33 cm Movement: Present     General:  Alert, oriented and cooperative. Patient is in no acute distress.  Skin: Skin is warm and dry. No rash noted.   Cardiovascular: Normal heart rate noted  Respiratory: Normal respiratory effort, no problems with respiration noted  Abdomen: Soft, gravid, appropriate for gestational age.  Pain/Pressure: Present     Pelvic: Cervical exam deferred        Extremities: Normal range of motion.  Edema: None  Mental Status: Normal mood and affect. Normal behavior. Normal judgment and thought content.   Assessment and Plan:  Pregnancy: G3P2002 at [redacted]w[redacted]d 1. Encounter for supervision of low-risk pregnancy in third trimester -patient doing well, no complaints.   2. Uterine size-date discrepancy in third trimester -[place] ordered for FH and also interval growth due to late entry to prenatal care.  - [place] MFM OB FOLLOW UP; Future  Preterm labor symptoms and general obstetric precautions including but not limited to vaginal bleeding, contractions, leaking of fluid and fetal movement were reviewed in detail with the patient. Please  refer to After Visit Summary for other counseling recommendations.   Return in about 2 weeks (around 04/02/2018), or LROB.  Future Appointments  Date Time Provider Department Center  03/21/2018 12:45 PM [facility] [place] 2 [facility]-MFCUS M[facility]-US  04/02/2018  2:35 PM Illianna Paschal, Charlesetta Garibaldi, CNM [facility]    Charlesetta Garibaldi [place], [place]

## 2018-03-21 ENCOUNTER — Ambulatory Visit (HOSPITAL_COMMUNITY)
Admission: RE | Admit: 2018-03-21 | Discharge: 2018-03-21 | Disposition: A | Discharge status: Home / Self Care | Payer: Medicaid Other | Source: Ambulatory Visit | Attending: Student | Admitting: Student

## 2018-03-21 ENCOUNTER — Other Ambulatory Visit: Payer: Self-pay

## 2018-03-21 DIAGNOSIS — O26843 Uterine size-date discrepancy, third trimester: Secondary | ICD-10-CM

## 2018-03-21 DIAGNOSIS — O0933 Supervision of pregnancy with insufficient antenatal care, third trimester: Secondary | ICD-10-CM

## 2018-03-21 DIAGNOSIS — Z3A35 35 weeks gestation of pregnancy: Secondary | ICD-10-CM | POA: Diagnosis not present

## 2018-03-21 DIAGNOSIS — Z362 Encounter for other antenatal screening follow-up: Secondary | ICD-10-CM | POA: Diagnosis not present

## 2018-03-26 ENCOUNTER — Encounter: Payer: Self-pay | Admitting: *Deleted

## 2018-04-02 ENCOUNTER — Other Ambulatory Visit: Payer: Self-pay

## 2018-04-02 ENCOUNTER — Other Ambulatory Visit (HOSPITAL_COMMUNITY)
Admission: RE | Admit: 2018-04-02 | Discharge: 2018-04-02 | Disposition: A | Discharge status: Home / Self Care | Payer: Medicaid Other | Source: Ambulatory Visit | Attending: Student | Admitting: Student

## 2018-04-02 ENCOUNTER — Ambulatory Visit (INDEPENDENT_AMBULATORY_CARE_PROVIDER_SITE_OTHER): Payer: Medicaid Other | Admitting: Student

## 2018-04-02 VITALS — BP 104/71 | HR 76 | Temp 98.0°F | Wt 134.4 lb

## 2018-04-02 DIAGNOSIS — Z3493 Encounter for supervision of normal pregnancy, unspecified, third trimester: Secondary | ICD-10-CM | POA: Insufficient documentation

## 2018-04-02 DIAGNOSIS — Z3A37 37 weeks gestation of pregnancy: Secondary | ICD-10-CM

## 2018-04-02 DIAGNOSIS — O26843 Uterine size-date discrepancy, third trimester: Secondary | ICD-10-CM | POA: Diagnosis not present

## 2018-04-02 DIAGNOSIS — O26849 Uterine size-date discrepancy, unspecified trimester: Secondary | ICD-10-CM | POA: Insufficient documentation

## 2018-04-02 NOTE — Patient Instructions (Signed)

## 2018-04-02 NOTE — Progress Notes (Signed)
   PRENATAL VISIT NOTE  Subjective:  Amanda George is a 33 y.o. G3P2002 at [redacted]w[redacted]d being seen today for ongoing prenatal care.  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  Patient reports pelvic pressure. .  Contractions: Irritability. Vag. Bleeding: None.  Movement: Present. Denies leaking of fluid.   The following portions of the patient's history were reviewed and updated as appropriate: allergies, current medications, past family history, past medical history, past social history, past surgical history and problem list.   Objective:   Vitals:   04/02/18 1450  BP: 104/71  Pulse: 76  Temp: 98 F (36.7 C)  Weight: 134 lb 6.4 oz (61 kg)    Fetal Status: Fetal Heart Rate (bpm): 147 Fundal Height: 35 cm Movement: Present  Presentation: Vertex  General:  Alert, oriented and cooperative. Patient is in no acute distress.  Skin: Skin is warm and dry. No rash noted.   Cardiovascular: Normal heart rate noted  Respiratory: Normal respiratory effort, no problems with respiration noted  Abdomen: Soft, gravid, appropriate for gestational age.  Pain/Pressure: Present     Pelvic: Cervical exam deferred Dilation: 1 Effacement (%): Thick Station: Ballotable  Extremities: Normal range of motion.  Edema: None  Mental Status: Normal mood and affect. Normal behavior. Normal judgment and thought content.   Assessment and Plan:  Pregnancy: G3P2002 at [redacted]w[redacted]d 1. Encounter for supervision of low-risk pregnancy in third trimester -Reviewed [place] results from 3-19; EFW appropriate and FH is now measuring appropriate today.  -discussed blood pressure cuff with patient; she does not speak English at all and her husband barely speaks [place]. She does not feel like she is able to check blood pressure at home;  will keep with in person visits.  - GC/Chlamydia probe amp ([place])not at [facility] - Culture, beta strep (group b only)  Preterm labor symptoms and general obstetric precautions including but not limited to vaginal bleeding, contractions, leaking of fluid and fetal movement were reviewed in detail with the patient. Please refer to After Visit Summary for other counseling recommendations.   Return in about 1 week (around 04/09/2018), or LROB.  No future appointments.  Marylene Land, CNM

## 2018-04-03 LAB — GC/CHLAMYDIA PROBE AMP (~~LOC~~) NOT AT ARMC
Chlamydia: NEGATIVE
Neisseria Gonorrhea: NEGATIVE

## 2018-04-06 LAB — CULTURE, BETA STREP (GROUP B ONLY): Strep Gp B Culture: NEGATIVE

## 2018-04-08 ENCOUNTER — Telehealth: Payer: Self-pay | Admitting: Family Medicine

## 2018-04-08 NOTE — Telephone Encounter (Signed)
Called patient with Interpreter [phone], and left a message for her appointment being at [facility]

## 2018-04-09 ENCOUNTER — Other Ambulatory Visit: Payer: Self-pay

## 2018-04-09 ENCOUNTER — Encounter: Payer: Self-pay | Admitting: Student

## 2018-04-09 ENCOUNTER — Ambulatory Visit (INDEPENDENT_AMBULATORY_CARE_PROVIDER_SITE_OTHER): Payer: Medicaid Other | Admitting: Student

## 2018-04-09 ENCOUNTER — Telehealth: Payer: Self-pay | Admitting: Medical

## 2018-04-09 VITALS — BP 97/66 | HR 103 | Ht 62.0 in | Wt 136.4 lb

## 2018-04-09 DIAGNOSIS — Z3A38 38 weeks gestation of pregnancy: Secondary | ICD-10-CM

## 2018-04-09 DIAGNOSIS — Z3493 Encounter for supervision of normal pregnancy, unspecified, third trimester: Secondary | ICD-10-CM

## 2018-04-09 NOTE — Progress Notes (Signed)
ARABIC Interpreter [place] [phone] ROB.  C/o spotting since last visit/exam with white, mucousy 'solid' discharge x 1 week

## 2018-04-09 NOTE — Progress Notes (Signed)
   PRENATAL VISIT NOTE  Subjective:  Amanda George is a 33 y.o. G3P2002 at [redacted]w[redacted]d being seen today for ongoing prenatal care.  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  Patient reports no complaints and mucousy white discharge with no odor or itching since last visit. .  Contractions: Irritability. Vag. Bleeding: Scant.  Movement: Present. Denies leaking of fluid.   The following portions of the patient's history were reviewed and updated as appropriate: allergies, current medications, past family history, past medical history, past social history, past surgical history and problem list.   Objective:   Vitals:   04/09/18 1425 04/09/18 1430  BP: 97/66   Pulse: (!) 103   Weight: 136 lb 6.4 oz (61.9 kg)   Height:  5\' 2"  (1.575 m)    Fetal Status: Fetal Heart Rate (bpm): 149 Fundal Height: 37 cm Movement: Present     General:  Alert, oriented and cooperative. Patient is in no acute distress.  Skin: Skin is warm and dry. No rash noted.   Cardiovascular: Normal heart rate noted  Respiratory: Normal respiratory effort, no problems with respiration noted  Abdomen: Soft, gravid, appropriate for gestational age.  Pain/Pressure: Present     Pelvic: Cervical exam deferred        Extremities: Normal range of motion.  Edema: None  Mental Status: Normal mood and affect. Normal behavior. Normal judgment and thought content.   Assessment and Plan:  Pregnancy: G3P2002 at [redacted]w[redacted]d 1. Encounter for supervision of low-risk pregnancy in third trimester   -reassured patient of normalcy of discharge; reviewed warning signs.  -confirmed that patient knows where the new hospital is located -reviewed that [employer] will be calling her to set up her appts for next week at new site  Term labor symptoms  and general obstetric precautions including but not limited to vaginal bleeding, contractions, leaking of fluid and fetal movement were reviewed in detail with the patient. Please refer to After Visit Summary for other counseling recommendations.   Return in about 1 week (around 04/16/2018), or LROB with me.  Future Appointments  Date Time Provider Department Center  04/09/2018  2:55 PM Marylene Land, CNM [facility] None    Charlesetta Garibaldi [place], [place]

## 2018-04-09 NOTE — Telephone Encounter (Signed)
Called the patient with the interrupter id [phone] to confirm upcoming appointment. Also notified of new location.

## 2018-04-09 NOTE — Patient Instructions (Signed)

## 2018-04-12 ENCOUNTER — Encounter (HOSPITAL_COMMUNITY): Payer: Self-pay | Admitting: *Deleted

## 2018-04-12 ENCOUNTER — Other Ambulatory Visit: Payer: Self-pay

## 2018-04-12 ENCOUNTER — Inpatient Hospital Stay (HOSPITAL_COMMUNITY)
Admission: AD | Admit: 2018-04-12 | Discharge: 2018-04-13 | DRG: 807 | Disposition: A | Discharge status: Home / Self Care | Payer: Medicaid Other | Attending: Obstetrics and Gynecology | Admitting: Obstetrics and Gynecology

## 2018-04-12 ENCOUNTER — Inpatient Hospital Stay (HOSPITAL_COMMUNITY): Payer: Medicaid Other | Admitting: Anesthesiology

## 2018-04-12 ENCOUNTER — Inpatient Hospital Stay (HOSPITAL_COMMUNITY)
Admission: AD | Admit: 2018-04-12 | Discharge: 2018-04-12 | Disposition: A | Discharge status: Home / Self Care | Payer: Medicaid Other | Source: Home / Self Care | Attending: Obstetrics & Gynecology | Admitting: Obstetrics & Gynecology

## 2018-04-12 DIAGNOSIS — O0930 Supervision of pregnancy with insufficient antenatal care, unspecified trimester: Secondary | ICD-10-CM

## 2018-04-12 DIAGNOSIS — O3473 Maternal care for abnormality of vulva and perineum, third trimester: Secondary | ICD-10-CM | POA: Diagnosis present

## 2018-04-12 DIAGNOSIS — N90813 Female genital mutilation Type III status: Secondary | ICD-10-CM | POA: Diagnosis present

## 2018-04-12 DIAGNOSIS — Z23 Encounter for immunization: Secondary | ICD-10-CM

## 2018-04-12 DIAGNOSIS — O479 False labor, unspecified: Secondary | ICD-10-CM

## 2018-04-12 DIAGNOSIS — N90810 Female genital mutilation status, unspecified: Secondary | ICD-10-CM | POA: Diagnosis present

## 2018-04-12 DIAGNOSIS — Z3A38 38 weeks gestation of pregnancy: Secondary | ICD-10-CM

## 2018-04-12 DIAGNOSIS — O471 False labor at or after 37 completed weeks of gestation: Secondary | ICD-10-CM | POA: Insufficient documentation

## 2018-04-12 DIAGNOSIS — Z789 Other specified health status: Secondary | ICD-10-CM | POA: Diagnosis present

## 2018-04-12 DIAGNOSIS — O26893 Other specified pregnancy related conditions, third trimester: Secondary | ICD-10-CM | POA: Diagnosis present

## 2018-04-12 LAB — CBC
HCT: 35.3 % — ABNORMAL LOW (ref 36.0–46.0)
Hemoglobin: 12 g/dL (ref 12.0–15.0)
MCH: 30.9 pg (ref 26.0–34.0)
MCHC: 34 g/dL (ref 30.0–36.0)
MCV: 91 fL (ref 80.0–100.0)
Platelets: 100 10*3/uL — ABNORMAL LOW (ref 150–400)
RBC: 3.88 MIL/uL (ref 3.87–5.11)
RDW: 13.1 % (ref 11.5–15.5)
WBC: 6.5 10*3/uL (ref 4.0–10.5)
nRBC: 0 % (ref 0.0–0.2)

## 2018-04-12 LAB — TYPE AND SCREEN
ABO/RH(D): B POS
Antibody Screen: NEGATIVE

## 2018-04-12 MED ORDER — ONDANSETRON HCL 4 MG/2ML IJ SOLN: 4.0000 mg | INTRAMUSCULAR | Status: DC | PRN

## 2018-04-12 MED ORDER — OXYCODONE HCL 5 MG PO TABS: 5.0000 mg | ORAL_TABLET | ORAL | Status: DC | PRN

## 2018-04-12 MED ORDER — BENZOCAINE-MENTHOL 20-0.5 % EX AERO
1.0000 "application " | INHALATION_SPRAY | CUTANEOUS | Status: DC | PRN
  Administered 2018-04-12: 1 via TOPICAL
  Filled 2018-04-12: qty 56

## 2018-04-12 MED ORDER — PHENYLEPHRINE 40 MCG/ML (10ML) SYRINGE FOR IV PUSH (FOR BLOOD PRESSURE SUPPORT): 80.0000 ug | PREFILLED_SYRINGE | INTRAVENOUS | Status: DC | PRN

## 2018-04-12 MED ORDER — OXYTOCIN 40 UNITS IN NORMAL SALINE INFUSION - SIMPLE MED
2.5000 [IU]/h | INTRAVENOUS | Status: DC
  Filled 2018-04-12: qty 1000

## 2018-04-12 MED ORDER — FENTANYL CITRATE (PF) 100 MCG/2ML IJ SOLN
100.0000 ug | INTRAMUSCULAR | Status: DC | PRN
  Administered 2018-04-12: 12:00:00 100 ug via INTRAVENOUS
  Filled 2018-04-12: qty 2

## 2018-04-12 MED ORDER — ACETAMINOPHEN 325 MG PO TABS: 650.0000 mg | ORAL_TABLET | ORAL | Status: DC | PRN

## 2018-04-12 MED ORDER — LACTATED RINGERS IV SOLN: 500.0000 mL | INTRAVENOUS | Status: DC | PRN

## 2018-04-12 MED ORDER — ONDANSETRON HCL 4 MG/2ML IJ SOLN: 4.0000 mg | Freq: Four times a day (QID) | INTRAMUSCULAR | Status: DC | PRN

## 2018-04-12 MED ORDER — LACTATED RINGERS IV SOLN
500.0000 mL | Freq: Once | INTRAVENOUS | Status: AC
  Administered 2018-04-12: 1000 mL via INTRAVENOUS

## 2018-04-12 MED ORDER — EPHEDRINE 5 MG/ML INJ: 10.0000 mg | INTRAVENOUS | Status: DC | PRN

## 2018-04-12 MED ORDER — WITCH HAZEL-GLYCERIN EX PADS: 1.0000 "application " | MEDICATED_PAD | CUTANEOUS | Status: DC | PRN

## 2018-04-12 MED ORDER — SODIUM CHLORIDE (PF) 0.9 % IJ SOLN
INTRAMUSCULAR | Status: DC | PRN
  Administered 2018-04-12: 12 mL/h via EPIDURAL

## 2018-04-12 MED ORDER — ONDANSETRON HCL 4 MG PO TABS: 4.0000 mg | ORAL_TABLET | ORAL | Status: DC | PRN

## 2018-04-12 MED ORDER — LIDOCAINE-EPINEPHRINE (PF) 2 %-1:200000 IJ SOLN
INTRAMUSCULAR | Status: DC | PRN
  Administered 2018-04-12: 5 mL via EPIDURAL

## 2018-04-12 MED ORDER — DIPHENHYDRAMINE HCL 25 MG PO CAPS: 25.0000 mg | ORAL_CAPSULE | Freq: Four times a day (QID) | ORAL | Status: DC | PRN

## 2018-04-12 MED ORDER — FENTANYL-BUPIVACAINE-NACL 0.5-0.125-0.9 MG/250ML-% EP SOLN
12.0000 mL/h | EPIDURAL | Status: DC | PRN
  Filled 2018-04-12: qty 250

## 2018-04-12 MED ORDER — SENNOSIDES-DOCUSATE SODIUM 8.6-50 MG PO TABS
2.0000 | ORAL_TABLET | ORAL | Status: DC
  Administered 2018-04-12: 2 via ORAL
  Filled 2018-04-12: qty 2

## 2018-04-12 MED ORDER — PRENATAL MULTIVITAMIN CH
1.0000 | ORAL_TABLET | Freq: Every day | ORAL | Status: DC
  Administered 2018-04-13: 1 via ORAL
  Filled 2018-04-12: qty 1

## 2018-04-12 MED ORDER — OXYTOCIN BOLUS FROM INFUSION
500.0000 mL | Freq: Once | INTRAVENOUS | Status: AC
  Administered 2018-04-12: 500 mL via INTRAVENOUS

## 2018-04-12 MED ORDER — IBUPROFEN 600 MG PO TABS
600.0000 mg | ORAL_TABLET | Freq: Four times a day (QID) | ORAL | Status: DC
  Administered 2018-04-12 – 2018-04-13 (×5): 600 mg via ORAL
  Filled 2018-04-12 (×6): qty 1

## 2018-04-12 MED ORDER — ZOLPIDEM TARTRATE 5 MG PO TABS: 5.0000 mg | ORAL_TABLET | Freq: Every evening | ORAL | Status: DC | PRN

## 2018-04-12 MED ORDER — COCONUT OIL OIL: 1.0000 "application " | TOPICAL_OIL | Status: DC | PRN

## 2018-04-12 MED ORDER — LIDOCAINE HCL (PF) 1 % IJ SOLN
30.0000 mL | INTRAMUSCULAR | Status: DC | PRN
  Filled 2018-04-12: qty 30

## 2018-04-12 MED ORDER — SOD CITRATE-CITRIC ACID 500-334 MG/5ML PO SOLN: 30.0000 mL | ORAL | Status: DC | PRN

## 2018-04-12 MED ORDER — TETANUS-DIPHTH-ACELL PERTUSSIS 5-2.5-18.5 LF-MCG/0.5 IM SUSP: 0.5000 mL | Freq: Once | INTRAMUSCULAR | Status: DC

## 2018-04-12 MED ORDER — INFLUENZA VAC SPLIT QUAD 0.5 ML IM SUSY
0.5000 mL | PREFILLED_SYRINGE | INTRAMUSCULAR | Status: AC
  Administered 2018-04-13: 12:00:00 0.5 mL via INTRAMUSCULAR
  Filled 2018-04-12: qty 0.5

## 2018-04-12 MED ORDER — LACTATED RINGERS IV SOLN
INTRAVENOUS | Status: DC
  Administered 2018-04-12 (×2): via INTRAVENOUS

## 2018-04-12 MED ORDER — DIBUCAINE (PERIANAL) 1 % EX OINT: 1.0000 "application " | TOPICAL_OINTMENT | CUTANEOUS | Status: DC | PRN

## 2018-04-12 MED ORDER — DIPHENHYDRAMINE HCL 50 MG/ML IJ SOLN: 12.5000 mg | INTRAMUSCULAR | Status: DC | PRN

## 2018-04-12 MED ORDER — SIMETHICONE 80 MG PO CHEW: 80.0000 mg | CHEWABLE_TABLET | ORAL | Status: DC | PRN

## 2018-04-12 NOTE — MAU Note (Signed)
Pt reports to MAU c/o pelvic pressure and stating she is ctx every 4-5. No bleeding or LOF. +FM.

## 2018-04-12 NOTE — Discharge Instructions (Signed)
Braxton Hicks Contractions Contractions of the uterus can occur throughout pregnancy, but they are not always a sign that you are in labor. You may have practice contractions called Braxton Hicks contractions. These false labor contractions are sometimes confused with true labor. What are Braxton Hicks contractions? Braxton Hicks contractions are tightening movements that occur in the muscles of the uterus before labor. Unlike true labor contractions, these contractions do not result in opening (dilation) and thinning of the cervix. Toward the end of pregnancy (32-34 weeks), Braxton Hicks contractions can happen more often and may become stronger. These contractions are sometimes difficult to tell apart from true labor because they can be very uncomfortable. You should not feel embarrassed if you go to the hospital with false labor. Sometimes, the only way to tell if you are in true labor is for your health care provider to look for changes in the cervix. The health care provider will do a physical exam and may monitor your contractions. If you are not in true labor, the exam should show that your cervix is not dilating and your water has not broken. If there are no other health problems associated with your pregnancy, it is completely safe for you to be sent home with false labor. You may continue to have Braxton Hicks contractions until you go into true labor. How to tell the difference between true labor and false labor True labor  Contractions last 30-70 seconds.  Contractions become very regular.  Discomfort is usually felt in the top of the uterus, and it spreads to the lower abdomen and low back.  Contractions do not go away with walking.  Contractions usually become more intense and increase in frequency.  The cervix dilates and gets thinner. False labor  Contractions are usually shorter and not as strong as true labor contractions.  Contractions are usually irregular.  Contractions  are often felt in the front of the lower abdomen and in the groin.  Contractions may go away when you walk around or change positions while lying down.  Contractions get weaker and are shorter-lasting as time goes on.  The cervix usually does not dilate or become thin. Follow these instructions at home:   Take over-the-counter and prescription medicines only as told by your health care provider.  Keep up with your usual exercises and follow other instructions from your health care provider.  Eat and drink lightly if you think you are going into labor.  If Braxton Hicks contractions are making you uncomfortable: ? Change your position from lying down or resting to walking, or change from walking to resting. ? Sit and rest in a tub of warm water. ? Drink enough fluid to keep your urine pale yellow. Dehydration may cause these contractions. ? Do slow and deep breathing several times an hour.  Keep all follow-up prenatal visits as told by your health care provider. This is important. Contact a health care provider if:  You have a fever.  You have continuous pain in your abdomen. Get help right away if:  Your contractions become stronger, more regular, and closer together.  You have fluid leaking or gushing from your vagina.  You pass blood-tinged mucus (bloody show).  You have bleeding from your vagina.  You have low back pain that you never had before.  You feel your baby's head pushing down and causing pelvic pressure.  Your baby is not moving inside you as much as it used to. Summary  Contractions that occur before labor are   called Braxton Hicks contractions, false labor, or practice contractions.  Braxton Hicks contractions are usually shorter, weaker, farther apart, and less regular than true labor contractions. True labor contractions usually become progressively stronger and regular, and they become more frequent.  Manage discomfort from Braxton Hicks contractions  by changing position, resting in a warm bath, drinking plenty of water, or practicing deep breathing. This information is not intended to replace advice given to you by your health care provider. Make sure you discuss any questions you have with your health care provider. Document Released: 05/04/2016 Document Revised: 10/03/2016 Document Reviewed: 05/04/2016 Elsevier Interactive Patient Education  2019 [employer]  

## 2018-04-12 NOTE — Anesthesia Procedure Notes (Signed)
Epidural Patient location during procedure: OB Start time: 04/12/2018 12:21 PM End time: 04/12/2018 12:37 PM  Staffing Anesthesiologist: Lucretia Kern, MD Performed: anesthesiologist   Preanesthetic Checklist Completed: patient identified, pre-op evaluation, timeout performed, IV checked, risks and benefits discussed and monitors and equipment checked  Epidural Patient position: sitting Prep: DuraPrep Patient monitoring: heart rate, continuous pulse ox and blood pressure Approach: midline Location: L3-L4 Injection technique: LOR air  Needle:  Needle type: Tuohy  Needle gauge: 17 G Needle length: 9 cm Needle insertion depth: 5 cm Catheter type: closed end flexible Catheter size: 19 Gauge Catheter at skin depth: 10 cm Test dose: negative and 2% lidocaine with Epi 1:200 K  Assessment Events: blood not aspirated, injection not painful, no injection resistance, negative IV test and no paresthesia  Additional Notes Reason for block:procedure for pain

## 2018-04-12 NOTE — Lactation Note (Signed)
This note was copied from a baby's chart. Lactation Consultation Note  Patient Name: [name] [id number] Date: 04/12/2018 Reason for consult: Initial assessment;Early term 21-38.6wks  P3 mother whose infant is now 66 hours old.  Mother breast fed her other two children for 1 1/2 years each.  Baby is in the nursery currently for low temperatures.  Arabic interpreter, Mervat [phone]) used for interpretation.  Mother stated that baby has been latching well so far.  I did not assess mother's breasts at this time.  Encouraged mother to feed 8-12 times/24 hours or sooner if baby shows feeding cues.  Reviewed feeding cues.  Mother is familiar with hand expression and has been able to express colostrum drops.  Colostrum container provided and milk storage times reviewed.  Finger feeding demonstrated.  Mom made aware of O/P services, breastfeeding support groups, community resources, and our phone # for post-discharge questions. Mother will be a "stay at home" mother after maternity leave.  Father present.  Mother will call as needed for latch assistance but feels confident in her ability to breast feed.  Since I did not observe a latch I would follow up with a lactation visit tomorrow.  RN in room.   Maternal Data Formula Feeding for Exclusion: No Has patient been taught Hand Expression?: Yes Does the patient have breastfeeding experience prior to this delivery?: Yes  Feeding Feeding Type: Breast Fed  LATCH Score Latch: Repeated attempts needed to sustain latch, nipple held in mouth throughout feeding, stimulation needed to elicit sucking reflex.  Audible Swallowing: A few with stimulation  Type of Nipple: Everted at rest and after stimulation  Comfort (Breast/Nipple): Soft / non-tender  Hold (Positioning): No assistance needed to correctly position infant at breast.  LATCH Score: 8  Interventions    Lactation Tools Discussed/Used     Consult Status Consult Status:  Follow-up Date: 04/13/18 Follow-up type: In-patient    Amanda George 04/12/2018, 6:04 PM

## 2018-04-12 NOTE — MAU Note (Signed)
Pt states her contractions are stronger now than when she was here before.  Denies bleeding or LOF.

## 2018-04-12 NOTE — Discharge Summary (Signed)
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 yo [id number] at 6368w4d was admitted in Active Labor on 04/12/2018. She received an epidural and then progressed spontaneously to completely dilated.  Patient had an uncomplicated labor course as follows:  Membrane Rupture Time/Date: 1:02 PM ,04/12/2018   Intrapartum Procedures: Episiotomy: None [1]                                         Lacerations:  None [1]  Patient had a delivery of a Viable infant, followed by a repair of Type III infibulation which had been cut to facilitate delivery (see Delivery Summary). 04/12/2018  Information for the patient's newborn:  Michiel Sitesssa, Girl Hollyanne [[id number]][[id number]]  Delivery Method: Vaginal, Spontaneous(Filed from Delivery Summary)    Pateint had an uncomplicated postpartum course.  She is ambulating, tolerating a regular diet, passing flatus, and urinating well. Patient is discharged home in stable condition on 04/13/18.   Magnesium Sulfate recieved: No BMZ received: No  Physical exam  Vitals:   04/12/18 1500 04/12/18 1550 04/12/18 2347 04/13/18 0500  BP: 106/73 113/85 102/71 97/68  Pulse: 64 73 68 69  Resp: 20 18 18 18   Temp: 98.4 F (36.9 C) 98.4 F (36.9 C) 98.6 F (37 C) 98.7 F (37.1 C)  TempSrc: Oral Oral Oral Oral  SpO2:      Weight:      Height:        General: alert and cooperative Lochia: appropriate Uterine Fundus: firm Incision: N/A DVT Evaluation: No evidence of DVT seen on physical exam. Labs: Lab Results  Component Value Date   WBC 6.5 04/12/2018   HGB 12.0 04/12/2018   HCT 35.3 (L) 04/12/2018   MCV 91.0 04/12/2018   PLT 100 (L) 04/12/2018   No flowsheet data found.  Discharge instruction: per After Visit Summary and "Baby and Me Booklet".  After visit meds:  Allergies as of 04/13/2018   No Known Allergies     Medication List    TAKE these medications   Concept OB 130-92.4-1 MG Caps Take 1 tablet by mouth daily.   ibuprofen 800 MG tablet Commonly known as:  ADVIL,MOTRIN Take 1 tablet (800 mg total) by mouth every 8 (eight) hours as needed.   senna-docusate 8.6-50 MG tablet Commonly known  as:  Senokot-S Take 2 tablets by mouth daily. Start taking on:  April 14, 2018       Diet: routine diet  Activity: Advance as tolerated. Pelvic rest for 6 weeks.   Outpatient follow up:4 weeks Follow up Appt: Future Appointments  Date Time Provider Department Center  04/16/2018  1:15 PM Burleson, Brand Males, NP [facility]   Follow up Visit:  Please schedule this patient for Postpartum visit in: 4 weeks with the following provider: Any provider For C/S patients schedule nurse incision check in weeks 2 weeks: no Low risk pregnancy complicated by: none Delivery mode:  SVD Anticipated Birth Control:  IUD PP Procedures needed: none  Schedule Integrated BH visit: no  Newborn Data: Live born female  Birth Weight: 2940gm  APGAR: 9, 9  Newborn Delivery   Birth date/time:  04/12/2018 13:08:00 Delivery type:  Vaginal, Spontaneous     Baby Feeding: Breast Disposition:home with mother   04/13/2018 De Hollingshead, DO

## 2018-04-12 NOTE — MAU Provider Note (Signed)
S: [name] is a 33 y.o. G3P2002 at [redacted]w[redacted]d  who presents to MAU today complaining of contractions irregularly.   minutes since this morning. She denies vaginal bleeding. She denies LOF. She reports normal fetal movement.    O: BP 106/73   Pulse 84   Temp 97.6 F (36.4 C) (Oral)   Resp 17   Wt 60.8 kg   LMP 07/16/2017   BMI 24.53 kg/m  GENERAL: Well-developed, well-nourished female in no acute distress.  HEAD: Normocephalic, atraumatic.  CHEST: Normal effort of breathing, regular heart rate ABDOMEN: Soft, nontender, gravid  Cervical exam:  Dilation: 3.5 Effacement (%): 70 Cervical Position: Anterior Station: -1 Presentation: Vertex Exam by:: Everlene Farrier, RN    Fetal Monitoring: Baseline: 130 bpm Variability: Moderate  Accelerations: 15x15 Decelerations: None Contractions: irregular   Cervix unchanged after 2 hours. Patient is comfortable with going home at this time.   A: [facility] at [redacted]w[redacted]d  False labor  P: Say close to the hospital, return when contractions are regular and painful.   Venia Carbon I, NP 04/12/2018 9:21 AM

## 2018-04-12 NOTE — H&P (Signed)
Amanda George is a 33 y.o. female G75P2002 @ 38.4wks presenting for onset of labor. Denies bldg or leaking; no H/A, N/V or visual disturbances. Her preg has been followed initially in [place] but then by [facility] since 28 wks and has been remarkable for 1) language barrier- Arabic 2) FGM 3) GBS neg. There was a concern over S<D, but growth u/s showed EFW 51% in the 3rd trimester.  **Arabic interpreter used via [employer]  OB History    Gravida  3   Para  2   Term  2   Preterm      AB      Living  2     SAB      TAB      Ectopic      Multiple      Live Births  2          Past Medical History:  Diagnosis Date  . Medical history non-contributory    Past Surgical History:  Procedure Laterality Date  . NO PAST SURGERIES     Family History: family history includes Healthy in her father and mother. Social History:  reports that she has never smoked. She has never used smokeless tobacco. She reports previous alcohol use. She reports that she does not use drugs.     Maternal Diabetes: No Genetic Screening: Declined Maternal Ultrasounds/Referrals: Normal Fetal Ultrasounds or other Referrals:  None Maternal Substance Abuse:  No Significant Maternal Medications:  None Significant Maternal Lab Results:  Lab values include: Group B Strep negative Other Comments:  None  ROS History Dilation: 7 Effacement (%): 70 Station: -2 Exam by:: Dorrene German RN Blood pressure 106/71, pulse 80, temperature 98.5 F (36.9 C), last menstrual period 07/16/2017. Exam Physical Exam  Constitutional: She is oriented to person, place, and time. She appears well-developed.  HENT:  Head: Normocephalic.  Neck: Normal range of motion.  Cardiovascular: Normal rate.  Respiratory: Effort normal.  GI:  EFM 130s, +accels, no decels, Cat 1 Ctx q 3-4 mins  Musculoskeletal: Normal range of motion.  Neurological: She is alert and oriented to person, place, and time.  Skin: Skin is warm and dry.   Psychiatric: She has a normal mood and affect. Her behavior is normal. Thought content normal.    Prenatal labs: ABO, Rh: B/Positive/-- (01/31 0949) Antibody: Negative (01/31 0949) Rubella: 7.67 (01/31 0949) RPR: Non Reactive (01/31 0949)  HBsAg: Negative (01/31 0949)  HIV: Non Reactive (01/31 0949)  GBS:   negative 04/02/18  Assessment/Plan: IUP@term  Active labor GBS neg  Admit to Labor & Delivery Expectant management Plans epidural Anticipate SVD   [name] CNM 04/12/2018, 11:38 AM

## 2018-04-12 NOTE — Anesthesia Preprocedure Evaluation (Signed)
Anesthesia Evaluation  Patient identified by MRN, date of birth, ID band Patient awake    Reviewed: Allergy & Precautions, H&P , NPO status , Patient's Chart, lab work & pertinent test results  History of Anesthesia Complications Negative for: history of anesthetic complications  Airway Mallampati: II  TM Distance: >3 FB Neck ROM: full    Dental no notable dental hx.    Pulmonary neg pulmonary ROS,    Pulmonary exam normal        Cardiovascular negative cardio ROS Normal cardiovascular exam Rhythm:regular Rate:Normal     Neuro/Psych negative neurological ROS  negative psych ROS   GI/Hepatic negative GI ROS, Neg liver ROS,   Endo/Other  negative endocrine ROS  Renal/GU negative Renal ROS  negative genitourinary   Musculoskeletal   Abdominal   Peds  Hematology  (+) Blood dyscrasia (thrombocytopenia), ,   Anesthesia Other Findings   Reproductive/Obstetrics (+) Pregnancy                             Anesthesia Physical Anesthesia Plan  ASA: II  Anesthesia Plan: Epidural   Post-op Pain Management:    Induction:   PONV Risk Score and Plan:   Airway Management Planned:   Additional Equipment:   Intra-op Plan:   Post-operative Plan:   Informed Consent: I have reviewed the patients History and Physical, chart, labs and discussed the procedure including the risks, benefits and alternatives for the proposed anesthesia with the patient or authorized representative who has indicated his/her understanding and acceptance.       Plan Discussed with:   Anesthesia Plan Comments:         Anesthesia Quick Evaluation

## 2018-04-13 LAB — RPR: RPR Ser Ql: NONREACTIVE

## 2018-04-13 LAB — ABO/RH: ABO/RH(D): B POS

## 2018-04-13 MED ORDER — IBUPROFEN 800 MG PO TABS: 800.0000 mg | ORAL_TABLET | Freq: Three times a day (TID) | ORAL | 1 refills | Status: DC | PRN

## 2018-04-13 MED ORDER — SENNOSIDES-DOCUSATE SODIUM 8.6-50 MG PO TABS: 2.0000 | ORAL_TABLET | ORAL | 0 refills | Status: DC

## 2018-04-13 NOTE — Discharge Instructions (Signed)
Vaginal Delivery, Care After °Refer to this sheet in the next few weeks. These instructions provide you with information about caring for yourself after vaginal delivery. Your health care provider may also give you more specific instructions. Your treatment has been planned according to current medical practices, but problems sometimes occur. Call your health care provider if you have any problems or questions. °What can I expect after the procedure? °After vaginal delivery, it is common to have: °· Some bleeding from your vagina. °· Soreness in your abdomen, your vagina, and the area of skin between your vaginal opening and your anus (perineum). °· Pelvic cramps. °· Fatigue. °Follow these instructions at home: °Medicines °· Take over-the-counter and prescription medicines only as told by your health care provider. °· If you were prescribed an antibiotic medicine, take it as told by your health care provider. Do not stop taking the antibiotic until it is finished. °Driving ° °· Do not drive or operate heavy machinery while taking prescription pain medicine. °· Do not drive for 24 hours if you received a sedative. °Lifestyle °· Do not drink alcohol. This is especially important if you are breastfeeding or taking medicine to relieve pain. °· Do not use tobacco products, including cigarettes, chewing tobacco, or e-cigarettes. If you need help quitting, ask your health care provider. °Eating and drinking °· Drink at least 8 eight-ounce glasses of water every day unless you are told not to by your health care provider. If you choose to breastfeed your baby, you may need to drink more water than this. °· Eat high-fiber foods every day. These foods may help prevent or relieve constipation. High-fiber foods include: °? Whole grain cereals and breads. °? Brown rice. °? Beans. °? Fresh fruits and vegetables. °Activity °· Return to your normal activities as told by your health care provider. Ask your health care provider what  activities are safe for you. °· Rest as much as possible. Try to rest or take a nap when your baby is sleeping. °· Do not lift anything that is heavier than your baby or 10 lb (4.5 kg) until your health care provider says that it is safe. °· Talk with your health care provider about when you can engage in sexual activity. This may depend on your: °? Risk of infection. °? Rate of healing. °? Comfort and desire to engage in sexual activity. °Vaginal Care °· If you have an episiotomy or a vaginal tear, check the area every day for signs of infection. Check for: °? More redness, swelling, or pain. °? More fluid or blood. °? Warmth. °? Pus or a bad smell. °· Do not use tampons or douches until your health care provider says this is safe. °· Watch for any blood clots that may pass from your vagina. These may look like clumps of dark red, brown, or black discharge. °General instructions °· Keep your perineum clean and dry as told by your health care provider. °· Wear loose, comfortable clothing. °· Wipe from front to back when you use the toilet. °· Ask your health care provider if you can shower or take a bath. If you had an episiotomy or a perineal tear during labor and delivery, your health care provider may tell you not to take baths for a certain length of time. °· Wear a bra that supports your breasts and fits you well. °· If possible, have someone help you with household activities and help care for your baby for at least a few days after you   leave the hospital. °· Keep all follow-up visits for you and your baby as told by your health care provider. This is important. °Contact a health care provider if: °· You have: °? Vaginal discharge that has a bad smell. °? Difficulty urinating. °? Pain when urinating. °? A sudden increase or decrease in the frequency of your bowel movements. °? More redness, swelling, or pain around your episiotomy or vaginal tear. °? More fluid or blood coming from your episiotomy or vaginal  tear. °? Pus or a bad smell coming from your episiotomy or vaginal tear. °? A fever. °? A rash. °? Little or no interest in activities you used to enjoy. °? Questions about caring for yourself or your baby. °· Your episiotomy or vaginal tear feels warm to the touch. °· Your episiotomy or vaginal tear is separating or does not appear to be healing. °· Your breasts are painful, hard, or turn red. °· You feel unusually sad or worried. °· You feel nauseous or you vomit. °· You pass large blood clots from your vagina. If you pass a blood clot from your vagina, save it to show to your health care provider. Do not flush blood clots down the toilet without having your health care provider look at them. °· You urinate more than usual. °· You are dizzy or light-headed. °· You have not breastfed at all and you have not had a menstrual period for 12 weeks after delivery. °· You have stopped breastfeeding and you have not had a menstrual period for 12 weeks after you stopped breastfeeding. °Get help right away if: °· You have: °? Pain that does not go away or does not get better with medicine. °? Chest pain. °? Difficulty breathing. °? Blurred vision or spots in your vision. °? Thoughts about hurting yourself or your baby. °· You develop pain in your abdomen or in one of your legs. °· You develop a severe headache. °· You faint. °· You bleed from your vagina so much that you fill two sanitary pads in one hour. °This information is not intended to replace advice given to you by your health care provider. Make sure you discuss any questions you have with your health care provider. °Document Released: 12/17/1999 Document Revised: 06/02/2015 Document Reviewed: 01/03/2015 °Elsevier Interactive Patient Education © 2019 Elsevier Inc. ° °

## 2018-04-13 NOTE — Anesthesia Postprocedure Evaluation (Signed)
Anesthesia Post Note  Patient: Tamico Champeau  Procedure(s) Performed: AN AD HOC LABOR EPIDURAL     Patient location during evaluation: Mother Baby Anesthesia Type: Epidural Level of consciousness: awake and oriented Pain management: pain level controlled Vital Signs Assessment: post-procedure vital signs reviewed and stable Respiratory status: spontaneous breathing and respiratory function stable Cardiovascular status: blood pressure returned to baseline and stable Postop Assessment: no headache Anesthetic complications: no    Last Vitals:  Vitals:   04/12/18 2347 04/13/18 0500  BP: 102/71 97/68  Pulse: 68 69  Resp: 18 18  Temp: 37 C 37.1 C  SpO2:      Last Pain:  Vitals:   04/13/18 1046  TempSrc:   PainSc: 0-No pain   Pain Goal:                Epidural/Spinal Function Cutaneous sensation: Normal sensation (04/13/18 1046), Patient able to flex knees: Yes (04/13/18 1046), Patient able to lift hips off bed: Yes (04/13/18 1046), Back pain beyond tenderness at insertion site: No (04/13/18 1046), Progressively worsening motor and/or sensory loss: No (04/13/18 1046), Bowel and/or bladder incontinence post epidural: No (04/13/18 1046)  Monie Shere

## 2018-04-16 ENCOUNTER — Encounter: Payer: Self-pay | Admitting: Nurse Practitioner

## 2018-04-25 ENCOUNTER — Telehealth: Payer: Self-pay | Admitting: Internal Medicine

## 2018-04-25 NOTE — Telephone Encounter (Signed)
Called the patient to inform of upcoming appointment. Left a voicemail instructing the patient to call our clinic with appointment information.

## 2018-05-13 ENCOUNTER — Telehealth: Payer: Self-pay | Admitting: Obstetrics & Gynecology

## 2018-05-13 NOTE — Telephone Encounter (Signed)
Called the patient to confirm the appointment, left a detailed voicemail message. °

## 2018-05-15 ENCOUNTER — Telehealth: Payer: Self-pay | Admitting: Family Medicine

## 2018-05-15 NOTE — Telephone Encounter (Signed)
Contacted patient with Arabic interpreter ID# [phone] to inform her that we have to reschedule her appointment with [place] on 5/14. Patient was rescheduled to 5/20 @ 3:55 and was informed that this visit will be done at our office and address was given. Patient verbalized understanding and had no further questions.

## 2018-05-16 ENCOUNTER — Ambulatory Visit: Payer: Self-pay | Admitting: Obstetrics and Gynecology

## 2018-05-21 ENCOUNTER — Telehealth: Payer: Self-pay | Admitting: Student

## 2018-05-21 NOTE — Telephone Encounter (Signed)
Called the patient to inform of upcoming visit, the patient verbalized understanding. °

## 2018-05-22 ENCOUNTER — Encounter: Payer: Self-pay | Admitting: Obstetrics & Gynecology

## 2018-05-22 ENCOUNTER — Other Ambulatory Visit: Payer: Self-pay

## 2018-05-22 ENCOUNTER — Ambulatory Visit (INDEPENDENT_AMBULATORY_CARE_PROVIDER_SITE_OTHER): Payer: Medicaid Other | Admitting: Obstetrics & Gynecology

## 2018-05-22 VITALS — BP 107/76 | HR 77 | Wt 141.0 lb

## 2018-05-22 DIAGNOSIS — Z975 Presence of (intrauterine) contraceptive device: Secondary | ICD-10-CM

## 2018-05-22 DIAGNOSIS — Z3202 Encounter for pregnancy test, result negative: Secondary | ICD-10-CM | POA: Diagnosis not present

## 2018-05-22 DIAGNOSIS — Z30430 Encounter for insertion of intrauterine contraceptive device: Secondary | ICD-10-CM

## 2018-05-22 DIAGNOSIS — Z1389 Encounter for screening for other disorder: Secondary | ICD-10-CM | POA: Diagnosis not present

## 2018-05-22 MED ORDER — LEVONORGESTREL 19.5 MCG/DAY IU IUD
INTRAUTERINE_SYSTEM | Freq: Once | INTRAUTERINE | Status: AC
  Administered 2018-05-22: 1 via INTRAUTERINE

## 2018-05-22 NOTE — Progress Notes (Signed)
Subjective:     Amanda George is a 33 y.o. female who presents for a postpartum visit. She is 5 weeks postpartum following a vaginal delivery. I have fully reviewed the prenatal and intrapartum course. The delivery was at 38.4 gestational weeks. Outcome: spontaneous vaginal delivery. Anesthesia: epidural. Postpartum course has been unremarkable. Baby's course has been unremarkable. Baby is feeding by breast. Bleeding staining only. Bowel function is normal. Bladder function is normal. Patient is not sexually active. Contraception method is none. Patient is interested in the IUD. Postpartum depression screening: negative.   Review of Systems Pertinent items are noted in HPI.   Objective:    Wt 141 lb (64 kg)   LMP 07/16/2017   BMI 25.79 kg/m   General:  alert   Breasts:  inspection negative, no nipple discharge or bleeding, no masses or nodularity palpable  Lungs: clear to auscultation bilaterally  Heart:  regular rate and rhythm, S1, S2 normal, no murmur, click, rub or gallop  Abdomen: soft, non-tender; bowel sounds normal; no masses,  no organomegaly   Vulva:  normal  Vagina: normal vagina  Cervix:  anteverted  Corpus: normal size, contour, position, consistency, mobility, non-tender  Adnexa:  normal adnexa  Rectal Exam: Not performed.        Assessment:  UPT negative, consent signed, Time out procedure done. Cervix prepped with betadine and grasped with a single tooth tenaculum. Liletta was easily placed and the strings were cut to 3-4 cm. Uterus sounded to 9 cm. She tolerated the procedure well.   Normal postpartum exam. Pap smear not done at today's visit.   Plan:    Contraception: IUD  Follow up in: 4 weeks for string check

## 2018-05-23 LAB — POCT PREGNANCY, URINE: Preg Test, Ur: NEGATIVE

## 2018-06-21 ENCOUNTER — Telehealth: Payer: Self-pay | Admitting: Obstetrics & Gynecology

## 2018-06-21 NOTE — Telephone Encounter (Signed)
Attempted to call patient about her appointment scheduled for 06/24/18.  Assisted with the help of the Interpreter [phone]. Left a detailed message about her visit today.

## 2018-06-24 ENCOUNTER — Ambulatory Visit: Payer: Medicaid Other | Admitting: Obstetrics & Gynecology

## 2018-07-03 ENCOUNTER — Ambulatory Visit (HOSPITAL_COMMUNITY)
Admission: EM | Admit: 2018-07-03 | Discharge: 2018-07-03 | Disposition: A | Discharge status: Home / Self Care | Payer: Medicaid Other | Attending: Internal Medicine | Admitting: Internal Medicine

## 2018-07-03 ENCOUNTER — Other Ambulatory Visit: Payer: Self-pay

## 2018-07-03 DIAGNOSIS — J020 Streptococcal pharyngitis: Secondary | ICD-10-CM | POA: Diagnosis not present

## 2018-07-03 LAB — POCT RAPID STREP A: Streptococcus, Group A Screen (Direct): NEGATIVE

## 2018-07-03 MED ORDER — LIDOCAINE VISCOUS HCL 2 % MT SOLN
OROMUCOSAL | Status: AC
  Filled 2018-07-03: qty 15

## 2018-07-03 MED ORDER — ACETAMINOPHEN 160 MG/5ML PO SOLN
ORAL | Status: AC
  Filled 2018-07-03: qty 20.3

## 2018-07-03 MED ORDER — AMOXICILLIN 500 MG PO CAPS: 500.0000 mg | ORAL_CAPSULE | Freq: Three times a day (TID) | ORAL | 0 refills | Status: DC

## 2018-07-03 MED ORDER — LIDOCAINE VISCOUS HCL 2 % MT SOLN: 15.0000 mL | Freq: Two times a day (BID) | OROMUCOSAL | 0 refills | Status: DC | PRN

## 2018-07-03 MED ORDER — ACETAMINOPHEN 160 MG/5ML PO SOLN
650.0000 mg | Freq: Once | ORAL | Status: AC
  Administered 2018-07-03: 650 mg via ORAL

## 2018-07-03 MED ORDER — LIDOCAINE VISCOUS HCL 2 % MT SOLN
15.0000 mL | Freq: Once | OROMUCOSAL | Status: AC
  Administered 2018-07-03: 16:00:00 15 mL via OROMUCOSAL

## 2018-07-03 NOTE — ED Provider Notes (Signed)
[facility]    CSN: [id number] Arrival date & time: 07/03/18  1437      History   Chief Complaint Chief Complaint  Patient presents with  . Sore Throat    HPI Amanda George is a 33 y.o. female with no past medical history comes to urgent care with complaints of severe sore throat, fever and bilateral ear pain of 2 days duration.  Patient says symptoms started insidiously and is gotten progressively worse.  Pain is severe, constant.  Is aggravated by swallowing.  No known relieving factors.  Patient admits to having associated generalized body aches with chills and fever.  No nausea, vomiting or diarrhea.  No sick contacts.  No loss of taste or sense of smell.   HPI  Past Medical History:  Diagnosis Date  . Medical history non-contributory     Patient Active Problem List   Diagnosis Date Noted  . Normal labor 04/12/2018  . Uterine size date discrepancy 04/02/2018  . 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 Supervision of low-risk pregnancy 02/01/2018  . Late prenatal care 02/01/2018  . Language barrier 02/01/2018    Past Surgical History:  Procedure Laterality Date  . NO PAST SURGERIES      OB History    Gravida  3   Para  3   Term  3   Preterm      AB      Living  3     SAB      TAB      Ectopic      Multiple  0   Live Births  3            Home Medications    Prior to Admission medications   Medication Sig Start Date End Date Taking? Authorizing Provider  amoxicillin (AMOXIL) 500 MG capsule Take 1 capsule (500 mg total) by mouth 3 (three) times daily. 07/03/18   , Britta MccreedyPhilip O, MD  lidocaine (XYLOCAINE) 2 % solution Use as directed 15 mLs in the mouth or throat 2 (two) times daily as needed for mouth pain. 07/03/18   , Britta MccreedyPhilip O, MD  Prenat w/o A Vit-FeFum-FePo-FA (CONCEPT OB) 130-92.4-1 MG CAPS Take 1 tablet by mouth daily. 12/23/17   Dorathy KinsmanSmith, [place], CNM    Family History Family History  Problem Relation Age of  Onset  . Healthy Mother   . Healthy Father     Social History Social History   Tobacco Use  . Smoking status: Never Smoker  . Smokeless tobacco: Never Used  Substance Use Topics  . Alcohol use: Not Currently  . Drug use: Never     Allergies   Patient has no known allergies.   Review of Systems Review of Systems  Constitutional: Positive for activity change, appetite change, chills and fever. Negative for fatigue.  HENT: Positive for ear pain and sore throat. Negative for dental problem, mouth sores, postnasal drip, rhinorrhea, sinus pressure and sinus pain.   Eyes: Negative.   Respiratory: Negative.   Cardiovascular: Negative.   Gastrointestinal: Negative.   Genitourinary: Negative for dysuria, frequency, pelvic pain and vaginal discharge.  Musculoskeletal: Positive for arthralgias and myalgias.  Skin: Negative.      Physical Exam Triage Vital Signs ED Triage Vitals  Enc Vitals Group     BP 07/03/18 1506 117/83     Pulse Rate 07/03/18 1506 (!) 110     Resp 07/03/18 1506 18     Temp 07/03/18 1506 (!) 100.9 F (38.3  C)     Temp Source 07/03/18 1506 Oral     SpO2 07/03/18 1506 100 %     Weight 07/03/18 1513 160 lb (72.6 kg)     Height --      Head Circumference --      Peak Flow --      Pain Score 07/03/18 1512 8     Pain Loc --      Pain Edu? --      Excl. in GC? --    No data found.  Updated Vital Signs BP 117/83 (BP Location: Right Arm)   Pulse (!) 110   Temp (!) 100.9 F (38.3 C) (Oral)   Resp 18   Wt 72.6 kg   LMP 07/16/2017   SpO2 100%   BMI 29.26 kg/m   Visual Acuity Right Eye Distance:   Left Eye Distance:   Bilateral Distance:    Right Eye Near:   Left Eye Near:    Bilateral Near:     Physical Exam Constitutional:      General: She is in acute distress.     Appearance: She is ill-appearing.  HENT:     Right Ear: Tympanic membrane normal.     Left Ear: Tympanic membrane normal.     Mouth/Throat:     Mouth: Mucous membranes are  moist.     Pharynx: Uvula midline. Oropharyngeal exudate and posterior oropharyngeal erythema present. No pharyngeal swelling.     Tonsils: Tonsillar exudate present. No tonsillar abscesses. 1+ on the right. 1+ on the left.  Eyes:     Conjunctiva/sclera: Conjunctivae normal.  Neck:     Musculoskeletal: Normal range of motion.  Cardiovascular:     Rate and Rhythm: Normal rate and regular rhythm.  Pulmonary:     Effort: Pulmonary effort is normal. No respiratory distress.     Breath sounds: Normal breath sounds. No wheezing or rales.  Abdominal:     General: There is no distension.     Palpations: Abdomen is soft.     Tenderness: There is no abdominal tenderness. There is no rebound.  Lymphadenopathy:     Cervical: No cervical adenopathy.  Skin:    General: Skin is warm.     Capillary Refill: Capillary refill takes less than 2 seconds.  Neurological:     General: No focal deficit present.     Mental Status: She is alert and oriented to person, place, and time.      UC Treatments / Results  Labs (all labs ordered are listed, but only abnormal results are displayed) Labs Reviewed  POCT INFECTIOUS MONO SCREEN    EKG   Radiology No results found.  Procedures Procedures (including critical care time)  Medications Ordered in UC Medications  lidocaine (XYLOCAINE) 2 % viscous mouth solution 15 mL (has no administration in time range)  acetaminophen (TYLENOL) solution 650 mg (has no administration in time range)  acetaminophen (TYLENOL) 160 MG/5ML solution (has no administration in time range)  lidocaine (XYLOCAINE) 2 % viscous mouth solution (has no administration in time range)    Initial Impression / Assessment and Plan / UC Course  I have reviewed the triage vital signs and the nursing notes.  Pertinent labs & imaging results that were available during my care of the patient were reviewed by me and considered in my medical decision making (see chart for details).      1.  Strep pharyngitis with severe throat pain: Rapid strep test is negative Throat cultures  have been sent Amoxicillin 500 mg 3 times daily for 7 days Lidocaine viscous gel 2% as needed for throat pain Given the symptoms of generalized body aches, fever, chills and sore throat-I will refer patient to [facility] to have COVID testing done. Final Clinical Impressions(s) / UC Diagnoses   Final diagnoses:  Strep throat   Discharge Instructions   None    ED Prescriptions    Medication Sig Dispense Auth. Provider   amoxicillin (AMOXIL) 500 MG capsule Take 1 capsule (500 mg total) by mouth 3 (three) times daily. 21 capsule , Myrene Galas, MD   lidocaine (XYLOCAINE) 2 % solution Use as directed 15 mLs in the mouth or throat 2 (two) times daily as needed for mouth pain. 100 mL Chase Picket, MD     Controlled Substance Prescriptions [place] Controlled Substance Registry consulted? No   Chase Picket, MD 07/03/18 [phone]

## 2018-07-03 NOTE — ED Notes (Signed)
Urince cultured should have been Discontinued but would not allow Dr. Lanny Cramp to.

## 2018-07-03 NOTE — ED Triage Notes (Signed)
Pt states she has a sore throat.x 2 days and pain in both ears.

## 2018-07-04 ENCOUNTER — Telehealth: Payer: Self-pay | Admitting: *Deleted

## 2018-07-04 NOTE — Telephone Encounter (Signed)
-----   Message from Chase Picket, MD sent at 07/03/2018  3:16 PM EDT ----- 33 year old female with sore throat, fever,sob, cough. Patient will benefit from covid testing

## 2018-07-04 NOTE — Telephone Encounter (Signed)
TC to patient using [employer] [phone]. Pt does not feel like going to get tested. Instructed to call us back if she changes her mind. Message sent to provider.

## 2018-07-05 LAB — CULTURE, GROUP A STREP (THRC)

## 2019-01-23 ENCOUNTER — Telehealth: Payer: Self-pay | Admitting: Obstetrics & Gynecology

## 2019-01-23 NOTE — Telephone Encounter (Signed)
Called pt with assistance of [employer] Telephone Interpreter,  [employer] # [id number]. Pt did not answer. LM for pt to call the office at her convenience.

## 2019-01-23 NOTE — Telephone Encounter (Signed)
Received a call from her friend stating she was having problems with her IUD. She wasn't sure what the problem was.

## 2019-03-17 ENCOUNTER — Telehealth: Payer: Self-pay | Admitting: General Practice

## 2019-03-17 NOTE — Telephone Encounter (Signed)
Received call from [place] with [employer] stating she is the patient's case Production designer, theatre/television/film. She states the patient has an appt tomorrow morning and wants a different birth control option other than the IUD. She states the patient reports a lot of problems with the IUD and wants the depo injection. She asked we call her back and let her know if this is possible.   Patient's concerns will be addressed at appt tomorrow.

## 2019-03-18 ENCOUNTER — Other Ambulatory Visit: Payer: Self-pay

## 2019-03-18 ENCOUNTER — Encounter: Payer: Self-pay | Admitting: Student

## 2019-03-18 ENCOUNTER — Ambulatory Visit (INDEPENDENT_AMBULATORY_CARE_PROVIDER_SITE_OTHER): Payer: Managed Care, Other (non HMO) | Admitting: Student

## 2019-03-18 VITALS — BP 122/84 | HR 92 | Ht 64.0 in | Wt 170.1 lb

## 2019-03-18 DIAGNOSIS — Z30013 Encounter for initial prescription of injectable contraceptive: Secondary | ICD-10-CM

## 2019-03-18 DIAGNOSIS — N898 Other specified noninflammatory disorders of vagina: Secondary | ICD-10-CM | POA: Diagnosis not present

## 2019-03-18 DIAGNOSIS — Z30432 Encounter for removal of intrauterine contraceptive device: Secondary | ICD-10-CM

## 2019-03-18 DIAGNOSIS — Z789 Other specified health status: Secondary | ICD-10-CM

## 2019-03-18 MED ORDER — MEDROXYPROGESTERONE ACETATE 150 MG/ML IM SUSP
150.0000 mg | Freq: Once | INTRAMUSCULAR | Status: AC
  Administered 2019-03-18: 10:00:00 150 mg via INTRAMUSCULAR

## 2019-03-18 NOTE — Patient Instructions (Signed)
Medroxyprogesterone injection [Contraceptive] What is this medicine? MEDROXYPROGESTERONE (me DROX ee proe JES te rone) contraceptive injections prevent pregnancy. They provide effective birth control for 3 months. Depo-subQ Provera 104 is also used for treating pain related to endometriosis. This medicine may be used for other purposes; ask your health care provider or pharmacist if you have questions. COMMON BRAND NAME(S): Depo-Provera, Depo-subQ Provera 104 What should I tell my health care provider before I take this medicine? They need to know if you have any of these conditions:  frequently drink alcohol  asthma  blood vessel disease or a history of a blood clot in the lungs or legs  bone disease such as osteoporosis  breast cancer  diabetes  eating disorder (anorexia nervosa or bulimia)  high blood pressure  HIV infection or AIDS  kidney disease  liver disease  mental depression  migraine  seizures (convulsions)  stroke  tobacco smoker  vaginal bleeding  an unusual or allergic reaction to medroxyprogesterone, other hormones, medicines, foods, dyes, or preservatives  pregnant or trying to get pregnant  breast-feeding How should I use this medicine? Depo-Provera Contraceptive injection is given into a muscle. Depo-subQ Provera 104 injection is given under the skin. These injections are given by a health care professional. You must not be pregnant before getting an injection. The injection is usually given during the first 5 days after the start of a menstrual period or 6 weeks after delivery of a baby. Talk to your pediatrician regarding the use of this medicine in children. Special care may be needed. These injections have been used in female children who have started having menstrual periods. Overdosage: If you think you have taken too much of this medicine contact a poison control center or emergency room at once. NOTE: This medicine is only for you. Do not  share this medicine with others. What if I miss a dose? Try not to miss a dose. You must get an injection once every 3 months to maintain birth control. If you cannot keep an appointment, call and reschedule it. If you wait longer than 13 weeks between Depo-Provera contraceptive injections or longer than 14 weeks between Depo-subQ Provera 104 injections, you could get pregnant. Use another method for birth control if you miss your appointment. You may also need a pregnancy test before receiving another injection. What may interact with this medicine? Do not take this medicine with any of the following medications:  bosentan This medicine may also interact with the following medications:  aminoglutethimide  antibiotics or medicines for infections, especially rifampin, rifabutin, rifapentine, and griseofulvin  aprepitant  barbiturate medicines such as phenobarbital or primidone  bexarotene  carbamazepine  medicines for seizures like ethotoin, felbamate, oxcarbazepine, phenytoin, topiramate  modafinil  St. John's wort This list may not describe all possible interactions. Give your health care provider a list of all the medicines, herbs, non-prescription drugs, or dietary supplements you use. Also tell them if you smoke, drink alcohol, or use illegal drugs. Some items may interact with your medicine. What should I watch for while using this medicine? This drug does not protect you against HIV infection (AIDS) or other sexually transmitted diseases. Use of this product may cause you to lose calcium from your bones. Loss of calcium may cause weak bones (osteoporosis). Only use this product for more than 2 years if other forms of birth control are not right for you. The longer you use this product for birth control the more likely you will be at risk   for weak bones. Ask your health care professional how you can keep strong bones. You may have a change in bleeding pattern or irregular periods.  Many females stop having periods while taking this drug. If you have received your injections on time, your chance of being pregnant is very low. If you think you may be pregnant, see your health care professional as soon as possible. Tell your health care professional if you want to get pregnant within the next year. The effect of this medicine may last a long time after you get your last injection. What side effects may I notice from receiving this medicine? Side effects that you should report to your doctor or health care professional as soon as possible:  allergic reactions like skin rash, itching or hives, swelling of the face, lips, or tongue  breast tenderness or discharge  breathing problems  changes in vision  depression  feeling faint or lightheaded, falls  fever  pain in the abdomen, chest, groin, or leg  problems with balance, talking, walking  unusually weak or tired  yellowing of the eyes or skin Side effects that usually do not require medical attention (report to your doctor or health care professional if they continue or are bothersome):  acne  fluid retention and swelling  headache  irregular periods, spotting, or absent periods  temporary pain, itching, or skin reaction at site where injected  weight gain This list may not describe all possible side effects. Call your doctor for medical advice about side effects. You may report side effects to FDA at 1-800-FDA-1088. Where should I keep my medicine? This does not apply. The injection will be given to you by a health care professional. NOTE: This sheet is a summary. It may not cover all possible information. If you have questions about this medicine, talk to your doctor, pharmacist, or health care provider.  2020 Elsevier/Gold Standard (2008-01-10 18:37:56)  

## 2019-03-18 NOTE — Progress Notes (Signed)
History:  [name] is a 34 y.o. [phone] who presents to clinic today for contraception management. Patient had liletta IUD placed during her postpartum visit on 05/22/2018. Since then has been unhappy with it due to vaginal discharge. She is interested in depo provera for contraception. Wants to wait at least a year before TTC again.    Patient Active Problem List   Diagnosis Date Noted  . 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 Language barrier 02/01/2018    No Known Allergies  No current outpatient medications on file prior to visit.   No current facility-administered medications on file prior to visit.     The following portions of the patient's history were reviewed and updated as appropriate: allergies, current medications, family history, past medical history, social history, past surgical history and problem list.  Review of Systems:  Other than those mentioned in HPI all ROS negative   Objective:  Physical Exam BP 122/84   Pulse 92   Ht 5\' 4"  (1.626 m)   Wt 170 lb 1.6 oz (77.2 kg)   BMI 29.20 kg/m  CONSTITUTIONAL: Well-developed, well-nourished female in no acute distress.  EYES: EOM intact, conjunctivae normal, no scleral icterus HEAD: Normocephalic, atraumatic RESPIRATORY: Effort and breath sounds normal, no problems with respiration noted.  GENITOURINARY: Normal appearing external genitalia; normal appearing vaginal mucosa and cervix. No abnormal discharge. Cervix pink/smooth/not friable. IUD strings visualized. SKIN: Skin is warm and dry. No rash noted. Not diaphoretic. No erythema. No pallor. PSYCHIATRIC: Normal mood and affect. Normal behavior. Normal judgment and thought content.  Procdure  IUD Removal  Patient was in the dorsal lithotomy position, normal external genitalia was noted.  A speculum was placed in the patient's vagina, normal discharge was noted, no lesions. The multiparous cervix was visualized, no lesions, no abnormal discharge.   The strings of the IUD were grasped and pulled using ring forceps. The IUD was removed in its entirety. Patient tolerated the procedure well.     Assessment & Plan:  1. Encounter for IUD removal -discussed with patient that increase in vaginal discharge is a common effect of the IUD. Patient sure that she wants it removed. Removed without complication.   2. Encounter for initial prescription of injectable contraceptive -Discussed decreased bone density with prolonged use.  -patient states she wants to TTC in a year. Discussed that this is not the best option for patient due to delay in return to fertility after discontinuation. Patient adamant that she does want depo & states she will only get 2 injections. Informed patient that even though it will likely take >10 months for return to fertility, that she still wouldn't have contraceptive coverage after discontinuation & could potentially get pregnant sooner than 1 year. She still wants to proceed with depo provera. Injection given & patient will be scheduled for next injection in 3 months.  - medroxyPROGESTERone (DEPO-PROVERA) injection 150 mg  3. Language barrier -Arabic interpreter at the bedside for this encounter   , NP 03/18/2019 10:44 AM

## 2019-03-24 ENCOUNTER — Ambulatory Visit: Payer: Medicaid Other | Admitting: Student

## 2019-04-28 ENCOUNTER — Encounter: Payer: Self-pay | Admitting: *Deleted

## 2019-06-18 ENCOUNTER — Ambulatory Visit: Payer: Medicaid Other

## 2019-07-30 ENCOUNTER — Ambulatory Visit: Payer: Medicaid Other | Attending: Internal Medicine

## 2019-07-30 DIAGNOSIS — Z23 Encounter for immunization: Secondary | ICD-10-CM

## 2019-07-30 NOTE — Progress Notes (Signed)
   Covid-19 Vaccination Clinic  Name:  Amanda George    MRN: [id number] DOB: 1985-01-16  07/30/2019  [name] was observed post Covid-19 immunization for 15 minutes without incident. She was provided with Vaccine Information Sheet and instruction to access the V-Safe system.   [name] was instructed to call 911 with any severe reactions post vaccine: Marland Kitchen Difficulty breathing  . Swelling of face and throat  . A fast heartbeat  . A bad rash all over body  . Dizziness and weakness   Immunizations Administered    Name Date Dose VIS Date Route   Pfizer COVID-19 Vaccine 07/30/2019 12:37 PM 0.3 mL 02/26/2018 Intramuscular   Manufacturer: [employer], [employer]   Lot: [id number]   NDC: [address]-7

## 2019-08-26 ENCOUNTER — Ambulatory Visit: Payer: Medicaid Other | Attending: Internal Medicine

## 2019-08-26 DIAGNOSIS — Z23 Encounter for immunization: Secondary | ICD-10-CM

## 2019-08-26 NOTE — Progress Notes (Signed)
   Covid-19 Vaccination Clinic  Name:  Ruberta Holck    MRN: [id number] DOB: [birth year]  08/26/2019  Ms. Knoche was observed post Covid-19 immunization for 15 minutes without incident. She was provided with Vaccine Information Sheet and instruction to access the V-Safe system.   Ms. Dermody was instructed to call 911 with any severe reactions post vaccine: Marland Kitchen Difficulty breathing  . Swelling of face and throat  . A fast heartbeat  . A bad rash all over body  . Dizziness and weakness   Immunizations Administered    Name Date Dose VIS Date Route   Pfizer COVID-19 Vaccine 08/26/2019  1:46 PM 0.3 mL 02/26/2018 Intramuscular   Manufacturer: [employer], [employer]   Lot: [id number]   NDC: [address]-6

## 2019-08-27 ENCOUNTER — Ambulatory Visit: Payer: Self-pay

## 2020-07-23 ENCOUNTER — Other Ambulatory Visit: Payer: Self-pay

## 2020-07-23 ENCOUNTER — Encounter (HOSPITAL_COMMUNITY): Payer: Self-pay

## 2020-07-23 ENCOUNTER — Ambulatory Visit (HOSPITAL_COMMUNITY)
Admission: EM | Admit: 2020-07-23 | Discharge: 2020-07-23 | Disposition: A | Discharge status: Home / Self Care | Payer: Medicaid Other | Attending: Emergency Medicine | Admitting: Emergency Medicine

## 2020-07-23 DIAGNOSIS — Z3201 Encounter for pregnancy test, result positive: Secondary | ICD-10-CM

## 2020-07-23 DIAGNOSIS — O219 Vomiting of pregnancy, unspecified: Secondary | ICD-10-CM | POA: Diagnosis not present

## 2020-07-23 DIAGNOSIS — R112 Nausea with vomiting, unspecified: Secondary | ICD-10-CM | POA: Diagnosis not present

## 2020-07-23 LAB — POCT URINALYSIS DIPSTICK, ED / UC
Glucose, UA: 100 mg/dL — AB
Ketones, ur: >=160 mg/dL — AB
Leukocytes,Ua: NEGATIVE
Nitrite: NEGATIVE
Protein, ur: 100 mg/dL — AB
Specific Gravity, Urine: 1.02 (ref 1.005–1.030)
Urobilinogen, UA: 1 mg/dL (ref 0.0–1.0)
pH: 7 (ref 5.0–8.0)

## 2020-07-23 LAB — POC URINE PREG, ED: Preg Test, Ur: POSITIVE — AB

## 2020-07-23 MED ORDER — PRENATAL COMPLETE 14-0.4 MG PO TABS: 1.0000 | ORAL_TABLET | Freq: Every day | ORAL | 0 refills | Status: DC

## 2020-07-23 NOTE — ED Provider Notes (Signed)
[facility]    CSN: [id number] Arrival date & time: 07/23/20  1515      History   Chief Complaint Chief Complaint  Patient presents with   Emesis    HPI Amanda George is a 35 y.o. female.   Patient here for evaluation of vomiting for the past 10 days.  Reports being unable to keep anything down.  Has not tried any OTC medications or treatments.  LMP 06/03/20.  Denies any trauma, injury, or other precipitating event.  Denies any specific alleviating or aggravating factors.  Denies any fevers, chest pain, shortness of breath, N/V/D, numbness, tingling, weakness, abdominal pain, or headaches.     The history is provided by the patient. The history is limited by a language barrier. A language interpreter was used.  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 Authorizing Provider  Prenatal Vit-Fe Fumarate-FA (PRENATAL COMPLETE) 14-0.4 MG TABS Take 1 tablet by mouth daily. 07/23/20  Yes Ivette Loyal, NP    Family History Family History  Problem Relation Age of Onset   Healthy Mother    Healthy Father     Social History Social History   Tobacco Use   Smoking status: Never   Smokeless tobacco: Never  Vaping Use   Vaping Use: Never used  Substance Use Topics   Alcohol use: Not Currently   Drug use: Never     Allergies   Patient has no known allergies.   Review of Systems Review of Systems  Gastrointestinal:  Positive for  nausea and vomiting.  All other systems reviewed and are negative.   Physical Exam Triage Vital Signs ED Triage Vitals  Enc Vitals Group     BP 07/23/20 1655 121/89     Pulse Rate 07/23/20 1655 (!) 101     Resp 07/23/20 1655 18     Temp 07/23/20 1655 98.8 F (37.1 C)     Temp Source 07/23/20 1655 Oral     SpO2 07/23/20 1655 98 %     Weight --      Height --      Head Circumference --      Peak Flow --      Pain Score 07/23/20 1651 0     Pain Loc --      Pain Edu? --      Excl. in GC? --    No data found.  Updated Vital Signs BP 121/89 (BP Location: Right Arm)   Pulse (!) 101   Temp 98.8 F (37.1 C) (Oral)   Resp 18   LMP 06/03/2020   SpO2 98%   Visual Acuity Right Eye  Distance:   Left Eye Distance:   Bilateral Distance:    Right Eye Near:   Left Eye Near:    Bilateral Near:     Physical Exam Vitals and nursing note reviewed.  Constitutional:      General: She is not in acute distress.    Appearance: Normal appearance. She is not ill-appearing, toxic-appearing or diaphoretic.  HENT:     Head: Normocephalic and atraumatic.  Eyes:     Conjunctiva/sclera: Conjunctivae normal.  Cardiovascular:     Rate and Rhythm: Normal rate.     Pulses: Normal pulses.  Pulmonary:     Effort: Pulmonary effort is normal.  Abdominal:     General: Abdomen is flat.  Musculoskeletal:        General: Normal range of motion.     Cervical back: Normal range of motion.  Skin:    General: Skin is warm and dry.  Neurological:     General: No focal deficit present.     Mental Status: She is alert and oriented to person, place, and time.  Psychiatric:        Mood and Affect: Mood normal.     UC Treatments / Results  Labs (all labs ordered are listed, but only abnormal results are displayed) Labs Reviewed  POCT URINALYSIS DIPSTICK, ED / UC - Abnormal; Notable for the following components:      Result Value   Glucose, UA 100 (*)    Bilirubin Urine SMALL (*)    Ketones, ur  >=160 (*)    Hgb urine dipstick TRACE (*)    Protein, ur 100 (*)    All other components within normal limits  POC URINE PREG, ED - Abnormal; Notable for the following components:   Preg Test, Ur POSITIVE (*)    All other components within normal limits    EKG   Radiology No results found.  Procedures Procedures (including critical care time)  Medications Ordered in UC Medications - No data to display  Initial Impression / Assessment and Plan / UC Course  I have reviewed the triage vital signs and the nursing notes.  Pertinent labs & imaging results that were available during my care of the patient were reviewed by me and considered in my medical decision making (see chart for details).    Assessment negative for red flags or concerns.  Urinalysis with no signs of infection.  Pregnancy test positive.  EDD 03/10/21 GA [redacted] weeks 1 day.  Prescribed prenatal vitamins.  Encouraged fluids.  May try ginger or mint for nausea.  Recommend small frequent meals and carrying crackers or snacks.  Follow up with [facility] for Healthcare.  Final Clinical Impressions(s) / UC Diagnoses   Final diagnoses:  Vomiting during pregnancy  Positive pregnancy test     Discharge Instructions      Take the prenatal vitamin daily.    Make sure you are drinking plenty of fluids, especially water.  You can try mint and ginger for nausea and vomiting.    Try eating small frequency meals, and carrying snacks to help with nausea.    Follow up with [facility] for Healthcare as soon as possible for evaluation of pregnancy.       ED Prescriptions     Medication Sig Dispense Auth. Provider   Prenatal Vit-Fe Fumarate-FA (PRENATAL COMPLETE) 14-0.4 MG TABS Take 1 tablet by mouth daily. 60 tablet Ivette Loyal, NP      PDMP not reviewed this encounter.  Ivette Loyal, NP 07/23/20 1739

## 2020-07-23 NOTE — ED Triage Notes (Signed)
Pt c/o Vomiting for about 10 days. Not able to keep fluids/ food down. Interventions: none

## 2020-07-23 NOTE — Discharge Instructions (Addendum)
Take the prenatal vitamin daily.    Make sure you are drinking plenty of fluids, especially water.  You can try mint and ginger for nausea and vomiting.    Try eating small frequency meals, and carrying snacks to help with nausea.    Follow up with [facility] for Healthcare as soon as possible for evaluation of pregnancy.

## 2020-09-02 ENCOUNTER — Encounter: Payer: Medicaid Other | Admitting: Obstetrics and Gynecology

## 2020-10-18 IMAGING — US US MFM OB FOLLOW UP
1 series · 14 of 28 positions shown · non-contrast
Comparison: none

[Series 1: us mfm ob follow up · 49 acquisitions, 14 frames shown]
[im 2/49]
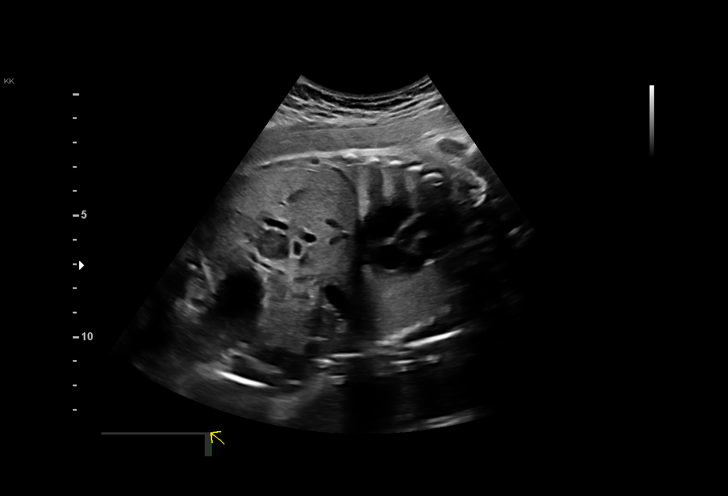
[im 6/49]
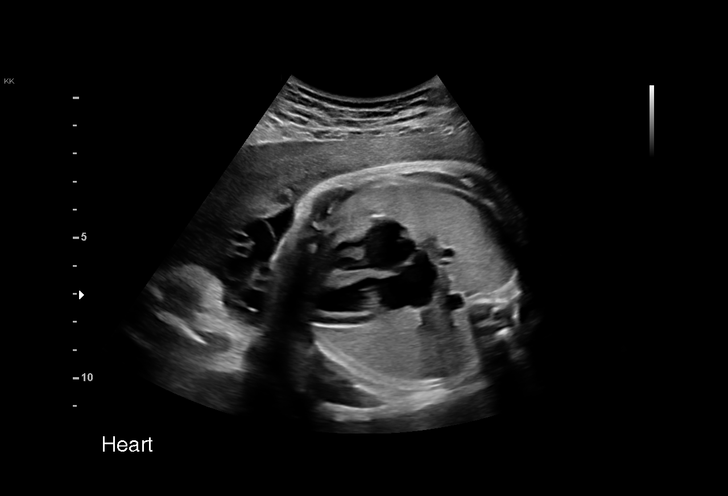
[im 9/49]
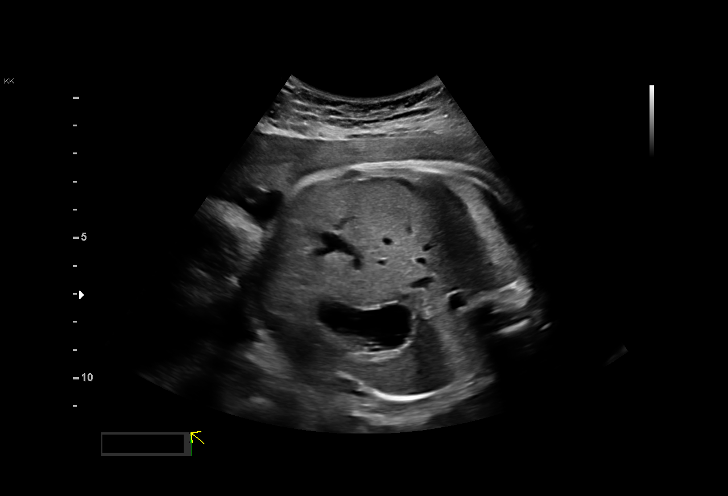
[im 13/49]
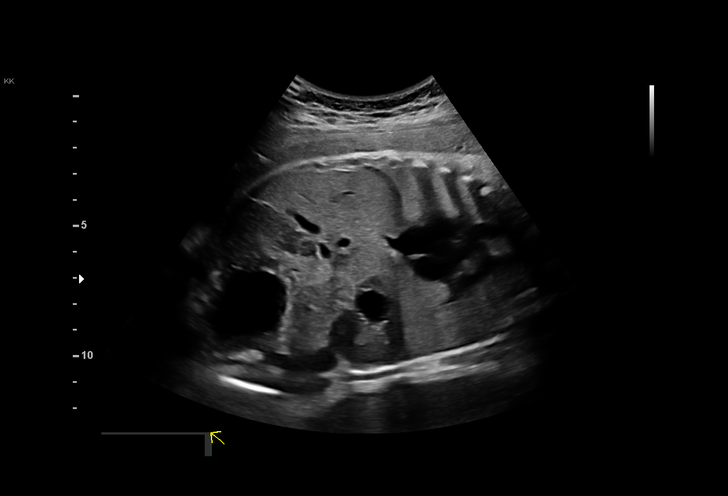
[im 17/49]
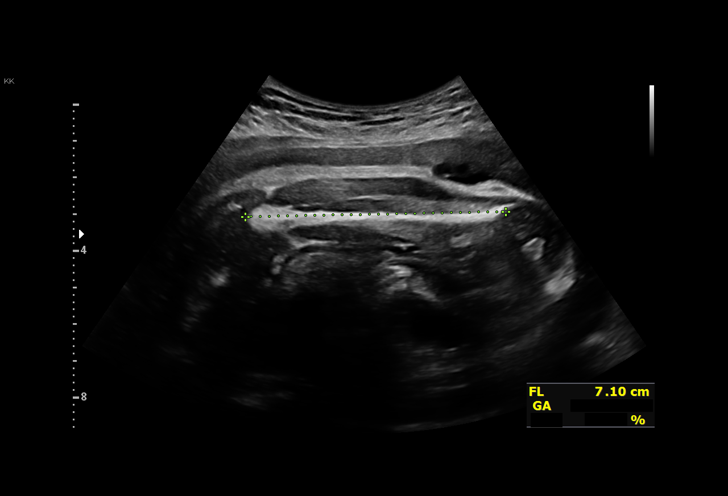
[im 20/49]
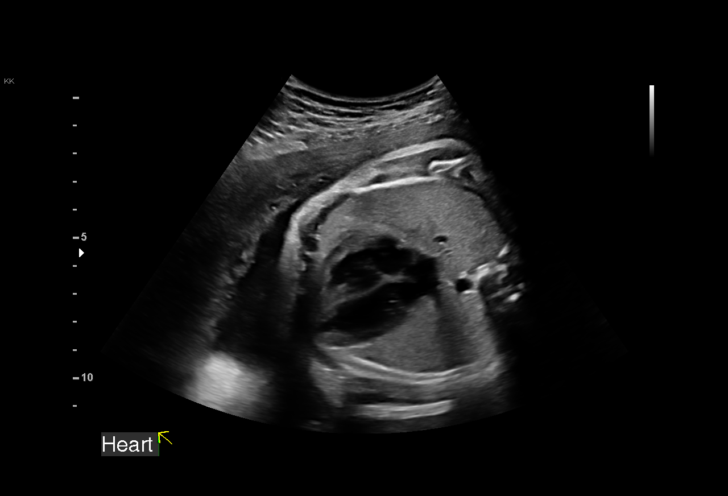
[im 24/49]
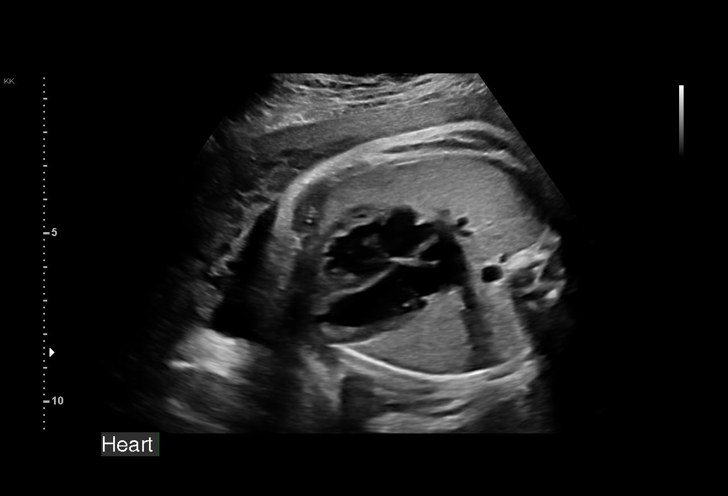
[im 27/49]
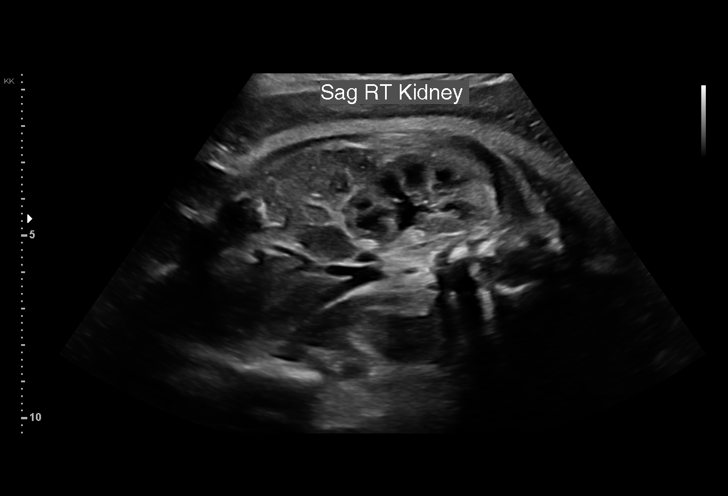
[im 31/49]
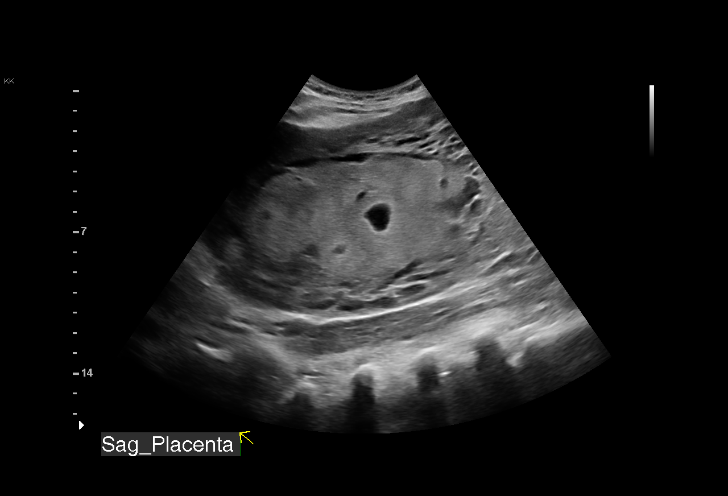
[im 34/49]
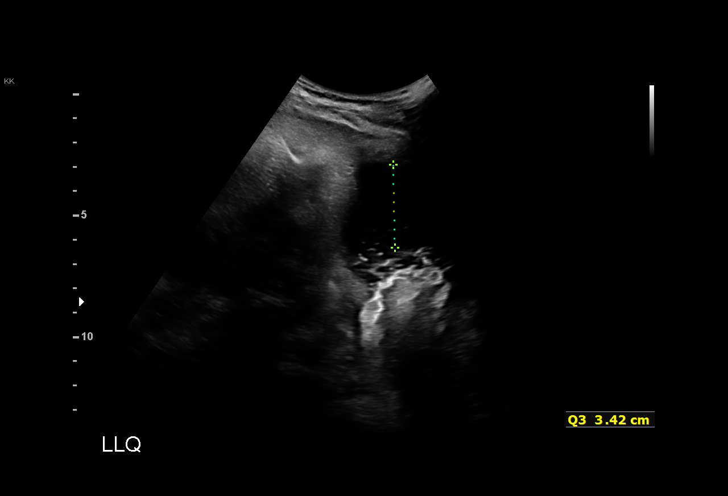
[im 38/49]
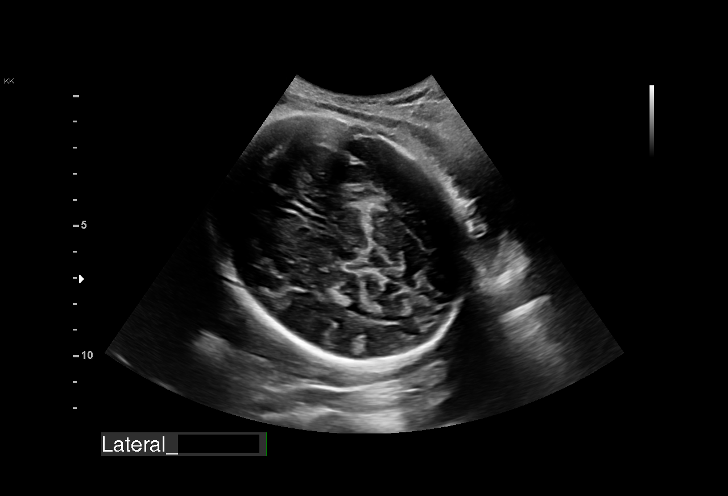
[im 41/49]
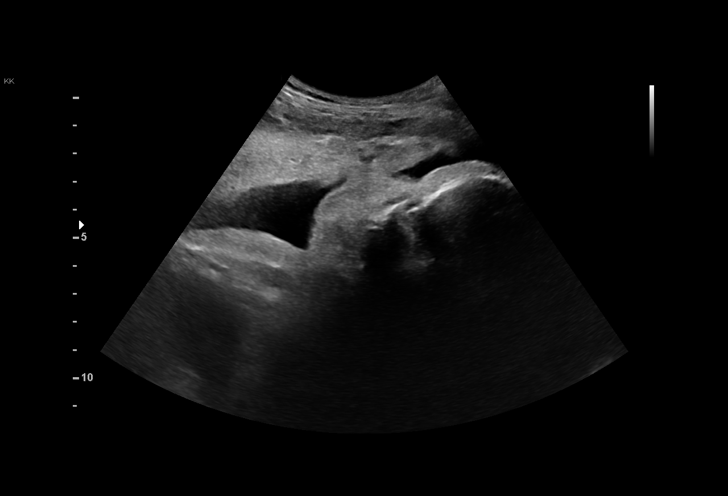
[im 45/49]
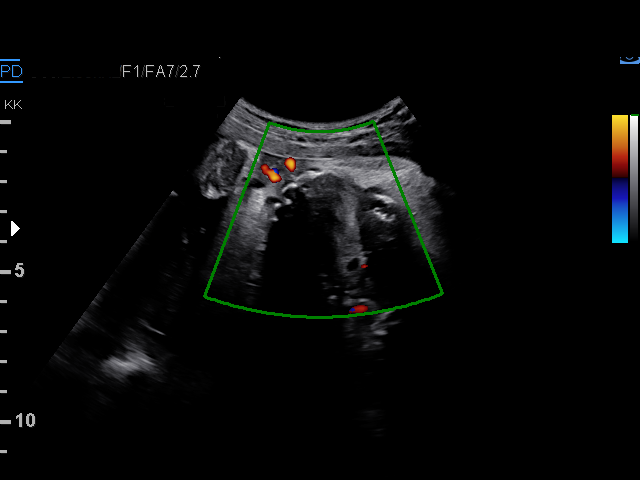
[im 49/49]
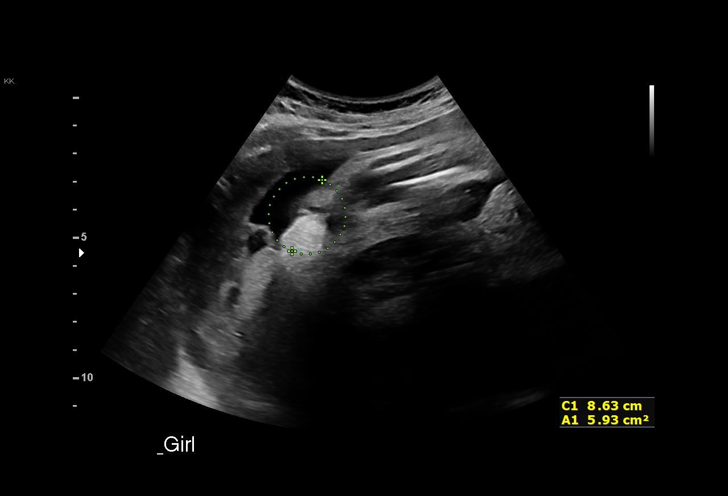

[14 of 28 positions shown; findings below may reference images not displayed]

CNM

 ----------------------------------------------------------------------

 ----------------------------------------------------------------------
Indications

  Encounter for other antenatal screening
  follow-up
  Late to prenatal care, third trimester
  Uterine size-date discrepancy, third trimester
  35 weeks gestation of pregnancy
 ----------------------------------------------------------------------
Vital Signs

 Weight (lb): 132
Fetal Evaluation

 Num Of Fetuses:          1
 Fetal Heart Rate(bpm):   151
 Cardiac Activity:        Observed
 Presentation:            Cephalic
 Placenta:                Posterior
 P. Cord Insertion:       Previously Visualized

 Amniotic Fluid
 AFI FV:      Within normal limits

 AFI Sum(cm)     %Tile       Largest Pocket(cm)
 13.56           47

 RUQ(cm)       RLQ(cm)       LUQ(cm)        LLQ(cm)

Biometry
 BPD:      83.1  mm     G. Age:  33w 3d          8  %    CI:        70.89   %    70 - 86
                                                         FL/HC:       22.6  %    20.1 -
 HC:      314.5  mm     G. Age:  35w 2d         17  %    HC/AC:       1.04       0.93 -
 AC:      302.7  mm     G. Age:  34w 2d         24  %    FL/BPD:      85.4  %    71 - 87
 FL:         71  mm     G. Age:  36w 3d         69  %    FL/AC:       23.5  %    20 - 24

 Est. FW:    7199   gm     5 lb 9 oz     51  %
OB History

 Gravidity:    3
 Living:       2
Gestational Age

 LMP:           35w 3d        Date:  07/16/17                 EDD:   04/22/18
 U/S Today:     34w 6d                                        EDD:   04/26/18
 Best:          35w 3d     Det. By:  LMP  (07/16/17)          EDD:   04/22/18
Anatomy

 Cranium:               Appears normal         LVOT:                   Previously seen
 Cavum:                 Previously seen        Aortic Arch:            Previously seen
 Ventricles:            Appears normal         Ductal Arch:            Previously seen
 Choroid Plexus:        Previously seen        Diaphragm:              Appears normal
 Cerebellum:            Previously seen        Stomach:                Appears normal, left
                                                                       sided
 Posterior Fossa:       Previously seen        Abdomen:                Appears normal
 Nuchal Fold:           Not applicable (>20    Abdominal Wall:         Not well visualized
                        wks GA)
 Face:                  Orbits and profile     Cord Vessels:           Previously seen
                        previously seen
 Lips:                  Previously seen        Kidneys:                Appear normal
 Palate:                Appears normal         Bladder:                Appears normal
 Thoracic:              Appears normal         Spine:                  Ltd views no
                                                                       intracranial signs of
                                                                       NTD
 Heart:                 Appears normal         Upper Extremities:      Previously seen
                        (4CH, axis, and
                        situs)
 RVOT:                  Previously seen        Lower Extremities:      Previously seen

 Other:  Female gender Heels, Nasal bone previously visualized. Technically
         difficult due to advanced GA and fetal position.
Cervix Uterus Adnexa

 Cervix
 Not visualized (advanced GA >12wks)
Impression

 Normal interval growth.
Recommendations

 Follow up growth as clincally indicated.

## 2020-10-21 ENCOUNTER — Encounter: Payer: Self-pay | Admitting: *Deleted

## 2020-10-28 ENCOUNTER — Encounter: Payer: Self-pay | Admitting: *Deleted

## 2020-11-10 ENCOUNTER — Other Ambulatory Visit: Payer: Self-pay

## 2020-11-10 ENCOUNTER — Ambulatory Visit (INDEPENDENT_AMBULATORY_CARE_PROVIDER_SITE_OTHER): Payer: Medicaid Other | Admitting: Family Medicine

## 2020-11-10 ENCOUNTER — Encounter: Payer: Self-pay | Admitting: Family Medicine

## 2020-11-10 VITALS — BP 125/78 | HR 92 | Wt 157.1 lb

## 2020-11-10 DIAGNOSIS — N90810 Female genital mutilation status, unspecified: Secondary | ICD-10-CM

## 2020-11-10 DIAGNOSIS — Z789 Other specified health status: Secondary | ICD-10-CM

## 2020-11-10 DIAGNOSIS — O0990 Supervision of high risk pregnancy, unspecified, unspecified trimester: Secondary | ICD-10-CM | POA: Insufficient documentation

## 2020-11-10 DIAGNOSIS — Z23 Encounter for immunization: Secondary | ICD-10-CM | POA: Diagnosis not present

## 2020-11-10 DIAGNOSIS — Z3482 Encounter for supervision of other normal pregnancy, second trimester: Secondary | ICD-10-CM | POA: Diagnosis not present

## 2020-11-10 NOTE — Progress Notes (Signed)
Subjective:   Amanda George is a 35 y.o. G4P3003 at [redacted]w[redacted]d by LMP being seen today for her first obstetrical visit.  Her obstetrical history is significant for  n/a . Patient does intend to breast feed. Pregnancy history fully reviewed.  Patient reports no complaints.  She was unable to get new OB appt sooner, she had appt two months ago but was unable to come due to transportation issues. This was soonest appt she was able to get. Very concerned about baby, reports a lot of n/v during first trimester, wants to make sure baby is OK.   HISTORY: OB History  Gravida Para Term Preterm AB Living  4 3 3  0 0 3  SAB IAB Ectopic Multiple Live Births  0 0 0 0 3    # Outcome Date GA Lbr Len/2nd Weight Sex Delivery Anes PTL Lv  4 Current           3 Term 04/12/18 [redacted]w[redacted]d 13:00 / 00:08 6 lb 7.7 oz (2.94 kg) F Vag-Spont EPI  LIV     Name: Cline,GIRL Ikeya     Apgar1: 9  Apgar5: 9  2 Term 04/15/15 [redacted]w[redacted]d   F Vag-Spont  N LIV     Birth Comments: Nuchal and body cord - delayed breathing and crying  1 Term 03/23/12 [redacted]w[redacted]d   M Vag-Spont  N LIV     Birth Comments: Nuchal cord @ birth, delayed breathing and crying     Name: [redacted]w[redacted]d     Last pap smear: Lab Results  Component Value Date   DIAGPAP  02/15/2018    NEGATIVE FOR INTRAEPITHELIAL LESIONS OR MALIGNANCY.   HPV NOT DETECTED 02/15/2018     Past Medical History:  Diagnosis Date   Medical history non-contributory    Past Surgical History:  Procedure Laterality Date   NO PAST SURGERIES     Family History  Problem Relation Age of Onset   Healthy Mother    Healthy Father    Social History   Tobacco Use   Smoking status: Never   Smokeless tobacco: Never  Vaping Use   Vaping Use: Never used  Substance Use Topics   Alcohol use: Not Currently   Drug use: Never   No Known Allergies Current Outpatient Medications on File Prior to Visit  Medication Sig Dispense Refill   Prenatal Vit-Fe Fumarate-FA (PRENATAL COMPLETE) 14-0.4 MG TABS  Take 1 tablet by mouth daily. 60 tablet 0   No current facility-administered medications on file prior to visit.     Exam   Vitals:   11/10/20 0830  BP: 125/78  Pulse: 92   Fetal Heart Rate (bpm): 145  System: General: well-developed, well-nourished female in no acute distress   Skin: normal coloration and turgor, no rashes   Neurologic: oriented, normal, negative, normal mood   Extremities: normal strength, tone, and muscle mass, ROM of all joints is normal   HEENT PERRLA, extraocular movement intact and sclera clear, anicteric   Neck supple and no masses   Respiratory:  no respiratory distress      Assessment:   Pregnancy: 13/09/22 Patient Active Problem List   Diagnosis Date Noted   Supervision of high risk pregnancy, antepartum 11/10/2020   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 Supervision of high risk pregnancy, antepartum BP and FHR normal Reassured patient that as long as abdomen is growing and she feels baby moving likely all is well with  pregnancy, but of course we will get labs and [place] ordered today Will also assist with transportation issues at checkout Initial labs drawn. Continue prenatal vitamins. Genetic Screening discussed, NIPS: ordered. Ultrasound discussed; fetal anatomic survey: ordered. Problem list reviewed and updated. The nature of [place] [facility] with multiple MDs and other Advanced Practice Providers was explained to patient; also emphasized that residents, students are part of our team.  2. 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 Language barrier Arabic interpreter present throughout encounter   Routine obstetric precautions reviewed. Return in 5 weeks (on 12/15/2020) for [facility], ob visit, 28 wk labs.

## 2020-11-11 LAB — CBC/D/PLT+RPR+RH+ABO+RUBIGG...
Antibody Screen: NEGATIVE
Basophils Absolute: 0 10*3/uL (ref 0.0–0.2)
Basos: 0 %
EOS (ABSOLUTE): 0.2 10*3/uL (ref 0.0–0.4)
Eos: 2 %
HCV Ab: 0.1 s/co ratio (ref 0.0–0.9)
HIV Screen 4th Generation wRfx: NONREACTIVE
Hematocrit: 31.4 % — ABNORMAL LOW (ref 34.0–46.6)
Hemoglobin: 10.5 g/dL — ABNORMAL LOW (ref 11.1–15.9)
Hepatitis B Surface Ag: NEGATIVE
Immature Grans (Abs): 0 10*3/uL (ref 0.0–0.1)
Immature Granulocytes: 0 %
Lymphocytes Absolute: 1.7 10*3/uL (ref 0.7–3.1)
Lymphs: 23 %
MCH: 29.4 pg (ref 26.6–33.0)
MCHC: 33.4 g/dL (ref 31.5–35.7)
MCV: 88 fL (ref 79–97)
Monocytes Absolute: 0.4 10*3/uL (ref 0.1–0.9)
Monocytes: 6 %
Neutrophils Absolute: 5 10*3/uL (ref 1.4–7.0)
Neutrophils: 69 %
Platelets: 209 10*3/uL (ref 150–450)
RBC: 3.57 x10E6/uL — ABNORMAL LOW (ref 3.77–5.28)
RDW: 12.4 % (ref 11.7–15.4)
RPR Ser Ql: NONREACTIVE
Rh Factor: POSITIVE
Rubella Antibodies, IGG: 7.26 index (ref >0.99)
WBC: 7.3 10*3/uL (ref 3.4–10.8)

## 2020-11-11 LAB — HCV INTERPRETATION

## 2020-11-11 LAB — HEMOGLOBIN A1C
Est. average glucose Bld gHb Est-mCnc: 105 mg/dL
Hgb A1c MFr Bld: 5.3 % (ref 4.8–5.6)

## 2020-11-12 ENCOUNTER — Encounter: Payer: Self-pay | Admitting: *Deleted

## 2020-11-12 LAB — CULTURE, OB URINE

## 2020-11-12 LAB — URINE CULTURE, OB REFLEX

## 2020-12-02 ENCOUNTER — Ambulatory Visit: Payer: Medicaid Other | Attending: Family Medicine

## 2020-12-02 ENCOUNTER — Ambulatory Visit: Payer: Medicaid Other

## 2020-12-13 ENCOUNTER — Other Ambulatory Visit: Payer: Self-pay | Admitting: *Deleted

## 2020-12-13 DIAGNOSIS — O0990 Supervision of high risk pregnancy, unspecified, unspecified trimester: Secondary | ICD-10-CM

## 2020-12-15 ENCOUNTER — Other Ambulatory Visit: Payer: Medicaid Other

## 2020-12-15 ENCOUNTER — Encounter: Payer: Self-pay | Admitting: Family Medicine

## 2020-12-15 ENCOUNTER — Ambulatory Visit (INDEPENDENT_AMBULATORY_CARE_PROVIDER_SITE_OTHER): Payer: Medicaid Other | Admitting: Certified Nurse Midwife

## 2020-12-15 ENCOUNTER — Other Ambulatory Visit: Payer: Self-pay

## 2020-12-15 VITALS — BP 115/75 | HR 92 | Wt 158.4 lb

## 2020-12-15 DIAGNOSIS — Z23 Encounter for immunization: Secondary | ICD-10-CM | POA: Diagnosis not present

## 2020-12-15 DIAGNOSIS — O0990 Supervision of high risk pregnancy, unspecified, unspecified trimester: Secondary | ICD-10-CM | POA: Diagnosis not present

## 2020-12-15 DIAGNOSIS — Z3A27 27 weeks gestation of pregnancy: Secondary | ICD-10-CM

## 2020-12-15 DIAGNOSIS — K219 Gastro-esophageal reflux disease without esophagitis: Secondary | ICD-10-CM

## 2020-12-15 DIAGNOSIS — O99619 Diseases of the digestive system complicating pregnancy, unspecified trimester: Secondary | ICD-10-CM

## 2020-12-15 DIAGNOSIS — Z789 Other specified health status: Secondary | ICD-10-CM

## 2020-12-15 DIAGNOSIS — O0992 Supervision of high risk pregnancy, unspecified, second trimester: Secondary | ICD-10-CM

## 2020-12-15 MED ORDER — OMEPRAZOLE 10 MG PO CPDR: 10.0000 mg | DELAYED_RELEASE_CAPSULE | Freq: Two times a day (BID) | ORAL | 2 refills | Status: DC

## 2020-12-15 MED ORDER — PRENATAL COMPLETE 14-0.4 MG PO TABS: 1.0000 | ORAL_TABLET | Freq: Every day | ORAL | 2 refills | Status: DC

## 2020-12-15 NOTE — Progress Notes (Signed)
Patient reports feeling "uncomfortable" after eating. She stated that she feels "short of breath" after meals.

## 2020-12-15 NOTE — Progress Notes (Signed)
° °  PRENATAL VISIT NOTE  Subjective:  Amanda George is a 35 y.o. G4P3003 at [redacted]w[redacted]d being seen today for ongoing prenatal care.  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  Patient reports  discomfort after eating, feels overly full, kind of short of breath and burning in her chest. No other physical complaints .  Contractions: Not present. Vag. Bleeding: None.  Movement: Present. Denies leaking of fluid.   The following portions of the patient's history were reviewed and updated as appropriate: allergies, current medications, past family history, past medical history, past social history, past surgical history and problem list.   Objective:   Vitals:   12/15/20 0830  BP: 115/75  Pulse: 92  Weight: 158 lb 6.4 oz (71.8 kg)    Fetal Status: Fetal Heart Rate (bpm): 148 Fundal Height: 27 cm Movement: Present     General:  Alert, oriented and cooperative. Patient is in no acute distress.  Skin: Skin is warm and dry. No rash noted.   Cardiovascular: Normal heart rate noted  Respiratory: Normal respiratory effort, no problems with respiration noted  Abdomen: Soft, gravid, appropriate for gestational age.  Pain/Pressure: Present     Pelvic: Cervical exam deferred        Extremities: Normal range of motion.     Mental Status: Normal mood and affect. Normal behavior. Normal judgment and thought content.   Assessment and Plan:  Pregnancy: G4P3003 at [redacted]w[redacted]d 1. Supervision of high risk pregnancy in second trimester - Doing well, feeling regular and vigorous fetal movement  - Prenatal Vit-Fe Fumarate-FA (PRENATAL COMPLETE) 14-0.4 MG TABS; Take 1 tablet by mouth daily.  Dispense: 60 tablet; Refill: 2  2. [redacted] weeks gestation of pregnancy - Routine OB care  - Tdap vaccine greater than or equal to 7yo IM  3. Gastroesophageal reflux  in pregnancy - omeprazole (PRILOSEC) 10 MG capsule; Take 1 capsule (10 mg total) by mouth in the morning and at bedtime. Start with once in the morning, can increase to one in the morning and one before bedtime.  Dispense: 60 capsule; Refill: 2  4. Language barrier Arabic interpreter Margaretmary Lombard) present for interpretation services  Preterm labor symptoms and general obstetric precautions including but not limited to vaginal bleeding, contractions, leaking of fluid and fetal movement were reviewed in detail with the patient. Please refer to After Visit Summary for other counseling recommendations.   Return in about 2 weeks (around 12/29/2020) for IN-PERSON, HOB.  Future Appointments  Date Time Provider Department Center  12/30/2020 10:35 AM Reva Bores, MD [facility]  01/11/2021  8:00 AM [facility] NURSE [facility]  01/11/2021  8:15 AM [facility] US2 [facility]    Bernerd Limbo, CNM

## 2020-12-16 LAB — GLUCOSE TOLERANCE, 2 HOURS W/ 1HR
Glucose, 1 hour: 160 mg/dL (ref 70–179)
Glucose, 2 hour: 124 mg/dL (ref 70–152)
Glucose, Fasting: 72 mg/dL (ref 70–91)

## 2020-12-30 ENCOUNTER — Other Ambulatory Visit: Payer: Self-pay

## 2020-12-30 ENCOUNTER — Ambulatory Visit (INDEPENDENT_AMBULATORY_CARE_PROVIDER_SITE_OTHER): Payer: Medicaid Other | Admitting: Family Medicine

## 2020-12-30 VITALS — BP 120/78 | HR 87 | Wt 165.2 lb

## 2020-12-30 DIAGNOSIS — O99619 Diseases of the digestive system complicating pregnancy, unspecified trimester: Secondary | ICD-10-CM

## 2020-12-30 DIAGNOSIS — Z789 Other specified health status: Secondary | ICD-10-CM

## 2020-12-30 DIAGNOSIS — O0990 Supervision of high risk pregnancy, unspecified, unspecified trimester: Secondary | ICD-10-CM

## 2020-12-30 DIAGNOSIS — K219 Gastro-esophageal reflux disease without esophagitis: Secondary | ICD-10-CM | POA: Insufficient documentation

## 2020-12-30 MED ORDER — OMEPRAZOLE 10 MG PO CPDR: 10.0000 mg | DELAYED_RELEASE_CAPSULE | Freq: Two times a day (BID) | ORAL | 2 refills | Status: DC

## 2020-12-30 MED ORDER — PRENATAL COMPLETE 14-0.4 MG PO TABS: 1.0000 | ORAL_TABLET | Freq: Every day | ORAL | 2 refills | Status: DC

## 2020-12-30 NOTE — Patient Instructions (Signed)

## 2020-12-30 NOTE — Progress Notes (Signed)
° °  PRENATAL VISIT NOTE  Subjective:  Amanda George is a 35 y.o. G4P3003 at [redacted]w[redacted]d being seen today for ongoing prenatal care.  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  Patient reports no complaints.  Contractions: Not present. Vag. Bleeding: None.  Movement: Present. Denies leaking of fluid.   The following portions of the patient's history were reviewed and updated as appropriate: allergies, current medications, past family history, past medical history, past social history, past surgical history and problem list.   Objective:   Vitals:   12/30/20 1040  BP: 120/78  Pulse: 87  Weight: 165 lb 3.2 oz (74.9 kg)    Fetal Status: Fetal Heart Rate (bpm): 136 Fundal Height: 30 cm Movement: Present     General:  Alert, oriented and cooperative. Patient is in no acute distress.  Skin: Skin is warm and dry. No rash noted.   Cardiovascular: Normal heart rate noted  Respiratory: Normal respiratory effort, no problems with respiration noted  Abdomen: Soft, gravid, appropriate for gestational age.  Pain/Pressure: Present     Pelvic: Cervical exam deferred        Extremities: Normal range of motion.  Edema: None  Mental Status: Normal mood and affect. Normal behavior. Normal judgment and thought content.   Assessment and Plan:  Pregnancy: G4P3003 at [redacted]w[redacted]d 1. Language barrier Arabic interpreter: in person used   2. Supervision of high risk pregnancy, antepartum - Prenatal Vit-Fe Fumarate-FA (PRENATAL COMPLETE) 14-0.4 MG TABS; Take 1 tablet by mouth daily.  Dispense: 60 tablet; Refill: 2  3. Gastroesophageal reflux in pregnancy Begin PPI - omeprazole (PRILOSEC) 10 MG capsule; Take 1 capsule (10 mg total) by mouth in the morning and at bedtime. Start with once in the morning, can increase  to one in the morning and one before bedtime.  Dispense: 60 capsule; Refill: 2  Preterm labor symptoms and general obstetric precautions including but not limited to vaginal bleeding, contractions, leaking of fluid and fetal movement were reviewed in detail with the patient. Please refer to After Visit Summary for other counseling recommendations.   Return in 2 weeks (on 01/13/2021) for [facility].  Future Appointments  Date Time Provider Department Center  01/11/2021  8:00 AM [facility] NURSE [facility]  01/11/2021  8:15 AM [facility] US2 [facility]  01/14/2021 11:15 AM Pinckney Bing, MD [facility]    Reva Bores, MD

## 2021-01-02 NOTE — L&D Delivery Note (Addendum)
OB/GYN Faculty Practice Delivery Note ? ?Amanda George is a 36 y.o. [id number] s/p SVD at [redacted]w[redacted]d. She was admitted for SOL.  ? ?ROM: 0h 59m with moderate meconium ?GBS Status: negative ?Maximum Maternal Temperature: 98.8 ? ?Labor Progress: ?Pt presented for SOL.  Progressed to complete without augmentation.  ? ?Delivery Date/Time: 03/05/2021 @ 1502 ? ?Delivery: Called to room and patient was complete and pushing. Head delivered OP. Loose nuchal cord present, reduced without difficulty. Shoulder and body delivered in usual fashion. Infant with spontaneous cry, placed on mother's abdomen, dried and stimulated. Cord clamped x 2 after 1-minute delay, and cut by FOB. Cord blood drawn. Placenta delivered spontaneously with gentle cord traction. Fundus firm with massage and Pitocin. Labia, perineum, vagina, and cervix inspected inspected with midline episiotomy (2nd degree perineum) and midline episiotomy of the upper aspect of the infibulation (1st degree) and midline episiotomy repaired with 3.0 CT-1 vicryl in usual fashion.  ? ?Placenta: Intact, 3 VC cord, and to L&D. ?Complications: None ?Lacerations: as noted above  ?EBL: 75 ?Analgesia: Epidural and local anesthesia ? ?Postpartum Planning ?[ ]  message to sent to schedule follow-up  ?[ ]  vaccines UTD ? ?Infant: NBM  APGARs 8/9  pending ?Transfer: Mom to postpartum.  Baby to Couplet care / Skin to Skin. ? ? , Student-MidWife ? ? ? ? ?I was present and gloved with the CNM student for the entire delivery. I agree with the note above.  ? ?10/9 DNP, CNM  ?03/05/21  4:59 PM  ? ?  ?

## 2021-01-11 ENCOUNTER — Ambulatory Visit: Payer: Medicaid Other

## 2021-01-11 ENCOUNTER — Ambulatory Visit: Payer: Medicaid Other | Attending: Family Medicine

## 2021-01-14 ENCOUNTER — Encounter: Payer: Self-pay | Admitting: Obstetrics and Gynecology

## 2021-01-14 ENCOUNTER — Ambulatory Visit (INDEPENDENT_AMBULATORY_CARE_PROVIDER_SITE_OTHER): Payer: Medicaid Other | Admitting: Obstetrics and Gynecology

## 2021-01-14 VITALS — BP 111/83 | HR 99 | Wt 162.2 lb

## 2021-01-14 DIAGNOSIS — Z789 Other specified health status: Secondary | ICD-10-CM

## 2021-01-14 DIAGNOSIS — Z3A32 32 weeks gestation of pregnancy: Secondary | ICD-10-CM

## 2021-01-14 DIAGNOSIS — O09529 Supervision of elderly multigravida, unspecified trimester: Secondary | ICD-10-CM | POA: Insufficient documentation

## 2021-01-14 DIAGNOSIS — O09523 Supervision of elderly multigravida, third trimester: Secondary | ICD-10-CM

## 2021-01-14 NOTE — Progress Notes (Signed)
Patient anatomy ultrasound scheduled for 01/25/21 at 0730. Patient notified.

## 2021-01-15 NOTE — Progress Notes (Signed)
° °  PRENATAL VISIT NOTE  Subjective:  Amanda George is a 36 y.o. G4P3003 at [redacted]w[redacted]d being seen today for ongoing prenatal care.  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  Patient upset b/c she did not go to he anatomy u/s appt three days ago. Patient knew about the appointment but didn't know it was an appointment for u/s and thought it wasn't appointment that was important to keep vs the one today.  Contractions: Irritability. Vag. Bleeding: None.  Movement: Present. Denies leaking of fluid.   The following portions of the patient's history were reviewed and updated as appropriate: allergies, current medications, past family history, past medical history, past social history, past surgical history and problem list.   Objective:   Vitals:   01/14/21 1150  BP: 111/83  Pulse: 99  Weight: 162 lb 3.2 oz (73.6 kg)    Fetal Status: Fetal Heart Rate (bpm): 146 Fundal Height: 32 cm Movement: Present     General:  Alert, oriented and cooperative. Patient is in no acute distress.  Skin: Skin is warm and dry. No rash noted.   Cardiovascular: Normal heart rate noted  Respiratory: Normal respiratory effort, no problems with respiration noted  Abdomen: Soft, gravid, appropriate for gestational age.  Pain/Pressure: Present     Pelvic: Cervical exam deferred        Extremities: Normal range of motion.  Edema: None  Mental Status: Normal mood and affect. Normal behavior. Normal judgment and thought content.   Assessment and Plan:  Pregnancy: G4P3003 at [redacted]w[redacted]d 1. [redacted] weeks gestation of pregnancy 28wk labs normal; will get patient rescheduled and importance of all appointments d/w her. Will try and make all her appointments the same day as her u/s.  2.  Multigravida of advanced maternal age in third trimester No issues  3. Language barrier Interpreter used  Preterm labor symptoms and general obstetric precautions including but not limited to vaginal bleeding, contractions, leaking of fluid and fetal movement were reviewed in detail with the patient. Please refer to After Visit Summary for other counseling recommendations.   No follow-ups on file.  Future Appointments  Date Time Provider [place]  01/25/2021  7:30 AM [facility] NURSE [facility]  01/25/2021  7:45 AM [facility] US6 [facility]  01/28/2021  9:55 AM Dione Plover, Annice Needy, MD [facility]    Aletha Halim, MD

## 2021-01-25 ENCOUNTER — Ambulatory Visit: Payer: Medicaid Other | Admitting: *Deleted

## 2021-01-25 ENCOUNTER — Other Ambulatory Visit: Payer: Self-pay | Admitting: *Deleted

## 2021-01-25 ENCOUNTER — Encounter: Payer: Self-pay | Admitting: *Deleted

## 2021-01-25 ENCOUNTER — Other Ambulatory Visit: Payer: Self-pay

## 2021-01-25 ENCOUNTER — Ambulatory Visit: Payer: Medicaid Other | Attending: Family Medicine

## 2021-01-25 VITALS — BP 118/81 | HR 90

## 2021-01-25 DIAGNOSIS — O0933 Supervision of pregnancy with insufficient antenatal care, third trimester: Secondary | ICD-10-CM

## 2021-01-25 DIAGNOSIS — O0990 Supervision of high risk pregnancy, unspecified, unspecified trimester: Secondary | ICD-10-CM | POA: Insufficient documentation

## 2021-01-25 DIAGNOSIS — O09523 Supervision of elderly multigravida, third trimester: Secondary | ICD-10-CM

## 2021-01-28 ENCOUNTER — Other Ambulatory Visit: Payer: Self-pay

## 2021-01-28 ENCOUNTER — Encounter: Payer: Self-pay | Admitting: Obstetrics and Gynecology

## 2021-01-28 ENCOUNTER — Ambulatory Visit (INDEPENDENT_AMBULATORY_CARE_PROVIDER_SITE_OTHER): Payer: Medicaid Other | Admitting: Obstetrics and Gynecology

## 2021-01-28 DIAGNOSIS — O0990 Supervision of high risk pregnancy, unspecified, unspecified trimester: Secondary | ICD-10-CM

## 2021-01-28 MED ORDER — PRENATAL COMPLETE 14-0.4 MG PO TABS: 1.0000 | ORAL_TABLET | Freq: Every day | ORAL | 2 refills | Status: DC

## 2021-01-28 MED ORDER — DOCUSATE SODIUM 100 MG PO CAPS: 100.0000 mg | ORAL_CAPSULE | Freq: Two times a day (BID) | ORAL | 0 refills | Status: DC

## 2021-01-28 MED ORDER — POLYETHYLENE GLYCOL 3350 17 G PO PACK: 17.0000 g | PACK | Freq: Every day | ORAL | 0 refills | Status: DC

## 2021-01-28 NOTE — Patient Instructions (Signed)
Safe Medications in Pregnancy   Constipation:  Colace  Ducolax suppositories  Fleet enema  Glycerin suppositories  Metamucil  Milk of magnesia  Miralax  Senokot  Smooth move tea

## 2021-01-28 NOTE — Progress Notes (Signed)
° °  PRENATAL VISIT NOTE  Subjective:  Amanda George is a 36 y.o. G4P3003 at [redacted]w[redacted]d being seen today for ongoing prenatal care.  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  Patient reports no complaints.  Contractions: Not present. Vag. Bleeding: None.  Movement: Present. Denies leaking of fluid.   The following portions of the patient's history were reviewed and updated as appropriate: allergies, current medications, past family history, past medical history, past social history, past surgical history and problem list.   Objective:   Vitals:   01/28/21 1004  BP: 100/69  Pulse: 98  Weight: 164 lb (74.4 kg)    Fetal Status: Fetal Heart Rate (bpm): 143   Movement: Present     General:  Alert, oriented and cooperative. Patient is in no acute distress.  Skin: Skin is warm and dry. No rash noted.   Cardiovascular: Normal heart rate noted  Respiratory: Normal respiratory effort, no problems with respiration noted  Abdomen: Soft, gravid, appropriate for gestational age.  Pain/Pressure: Absent     Pelvic: Cervical exam deferred        Extremities: Normal range of motion.  Edema: None  Mental Status: Normal mood and affect. Normal behavior. Normal judgment and thought content.  Perineum and anal visual inspection was normal except for small hemorrhoid Assessment and Plan:  Pregnancy: G4P3003 at [redacted]w[redacted]d 1. Supervision of high risk pregnancy, antepartum - Reviewed measures for constipation. Rx's printed for her convenience along with PNV rx.  - GBS next time.  - Interpreter used throughout our appt. (In person) - Prenatal Vit-Fe Fumarate-FA (PRENATAL COMPLETE) 14-0.4 MG TABS; Take 1 tablet by mouth daily.  Dispense: 60 tablet; Refill: 2  Preterm  labor symptoms and general obstetric precautions including but not limited to vaginal bleeding, contractions, leaking of fluid and fetal movement were reviewed in detail with the patient. Please refer to After Visit Summary for other counseling recommendations.   Return in about 2 weeks (around 02/11/2021) for OB VISIT, MD or APP.  Future Appointments  Date Time Provider [place] [facility]  02/15/2021  9:55 AM Clarnce Flock, MD [facility]  02/16/2021 10:30 AM [facility] NURSE [facility]  02/16/2021 10:45 AM [facility] US6 [facility]    Radene Gunning, MD

## 2021-02-15 ENCOUNTER — Other Ambulatory Visit (HOSPITAL_COMMUNITY)
Admission: RE | Admit: 2021-02-15 | Discharge: 2021-02-15 | Disposition: A | Discharge status: Home / Self Care | Payer: Medicaid Other | Source: Ambulatory Visit | Attending: Family Medicine | Admitting: Family Medicine

## 2021-02-15 ENCOUNTER — Ambulatory Visit (INDEPENDENT_AMBULATORY_CARE_PROVIDER_SITE_OTHER): Payer: Medicaid Other | Admitting: Family Medicine

## 2021-02-15 ENCOUNTER — Other Ambulatory Visit: Payer: Self-pay

## 2021-02-15 VITALS — BP 124/77 | HR 98 | Wt 168.2 lb

## 2021-02-15 DIAGNOSIS — O0990 Supervision of high risk pregnancy, unspecified, unspecified trimester: Secondary | ICD-10-CM | POA: Insufficient documentation

## 2021-02-15 DIAGNOSIS — O09523 Supervision of elderly multigravida, third trimester: Secondary | ICD-10-CM

## 2021-02-15 DIAGNOSIS — N90810 Female genital mutilation status, unspecified: Secondary | ICD-10-CM

## 2021-02-15 DIAGNOSIS — Z789 Other specified health status: Secondary | ICD-10-CM

## 2021-02-15 NOTE — Progress Notes (Signed)
° °  Subjective:  Amanda George is a 36 y.o. G4P3003 at [redacted]w[redacted]d being seen today for ongoing prenatal care.  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  Patient reports no complaints.  Contractions: Not present. Vag. Bleeding: None.  Movement: Present. Denies leaking of fluid.   The following portions of the patient's history were reviewed and updated as appropriate: allergies, current medications, past family history, past medical history, past social history, past surgical history and problem list. Problem list updated.  Objective:   Vitals:   02/15/21 0956  BP: 124/77  Pulse: 98  Weight: 168 lb 3.2 oz (76.3 kg)    Fetal Status: Fetal Heart Rate (bpm): 131   Movement: Present     General:  Alert, oriented and cooperative. Patient is in no acute distress.  Skin: Skin is warm and dry. No rash noted.   Cardiovascular: Normal heart rate noted  Respiratory: Normal respiratory effort, no problems with respiration noted  Abdomen: Soft, gravid, appropriate for gestational age. Pain/Pressure: Absent     Pelvic: Vag. Bleeding: None     Cervical exam deferred        Extremities: Normal range of motion.  Edema: None  Mental Status: Normal mood and affect. Normal behavior. Normal judgment and thought content.   Urinalysis:      Assessment and Plan:  Pregnancy: G4P3003 at [redacted]w[redacted]d  1. Supervision of high risk pregnancy, antepartum BP and FHR normal Swabs today, prefers female colleague collect them. Nolene Bernheim assisted Has growth scan scheduled today  2. 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 Language barrier Arabic  4. Multigravida of advanced maternal age in third trimester   Preterm labor symptoms  and general obstetric precautions including but not limited to vaginal bleeding, contractions, leaking of fluid and fetal movement were reviewed in detail with the patient. Please refer to After Visit Summary for other counseling recommendations.  Return in 1 week (on 02/22/2021).   Venora Maples, MD

## 2021-02-16 ENCOUNTER — Ambulatory Visit: Payer: Medicaid Other | Attending: Obstetrics

## 2021-02-16 ENCOUNTER — Ambulatory Visit: Payer: Medicaid Other | Admitting: *Deleted

## 2021-02-16 VITALS — BP 106/71 | HR 80

## 2021-02-16 DIAGNOSIS — O0990 Supervision of high risk pregnancy, unspecified, unspecified trimester: Secondary | ICD-10-CM | POA: Diagnosis not present

## 2021-02-16 DIAGNOSIS — O09523 Supervision of elderly multigravida, third trimester: Secondary | ICD-10-CM | POA: Insufficient documentation

## 2021-02-16 DIAGNOSIS — Z3A36 36 weeks gestation of pregnancy: Secondary | ICD-10-CM | POA: Diagnosis not present

## 2021-02-16 DIAGNOSIS — O0933 Supervision of pregnancy with insufficient antenatal care, third trimester: Secondary | ICD-10-CM | POA: Diagnosis not present

## 2021-02-16 LAB — GC/CHLAMYDIA PROBE AMP (~~LOC~~) NOT AT ARMC
Chlamydia: NEGATIVE
Comment: NEGATIVE
Comment: NORMAL
Neisseria Gonorrhea: NEGATIVE

## 2021-02-19 LAB — CULTURE, BETA STREP (GROUP B ONLY): Strep Gp B Culture: NEGATIVE

## 2021-03-03 ENCOUNTER — Ambulatory Visit (INDEPENDENT_AMBULATORY_CARE_PROVIDER_SITE_OTHER): Payer: Medicaid Other | Admitting: Family Medicine

## 2021-03-03 ENCOUNTER — Other Ambulatory Visit: Payer: Self-pay

## 2021-03-03 VITALS — BP 115/80 | HR 92 | Wt 170.5 lb

## 2021-03-03 DIAGNOSIS — Z789 Other specified health status: Secondary | ICD-10-CM

## 2021-03-03 DIAGNOSIS — O99619 Diseases of the digestive system complicating pregnancy, unspecified trimester: Secondary | ICD-10-CM

## 2021-03-03 DIAGNOSIS — O0990 Supervision of high risk pregnancy, unspecified, unspecified trimester: Secondary | ICD-10-CM

## 2021-03-03 DIAGNOSIS — K219 Gastro-esophageal reflux disease without esophagitis: Secondary | ICD-10-CM

## 2021-03-03 DIAGNOSIS — O09523 Supervision of elderly multigravida, third trimester: Secondary | ICD-10-CM

## 2021-03-03 NOTE — Progress Notes (Signed)
? ?  PRENATAL VISIT NOTE ? ?Subjective:  ?Amanda George is a 36 y.o. G4P3003 at [redacted]w[redacted]d being seen today for ongoing prenatal care.  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 ? ?Patient reports no complaints.  Contractions: Irritability. Vag. Bleeding: None.  Movement: Present. Denies leaking of fluid.  ? ?The following portions of the patient's history were reviewed and updated as appropriate: allergies, current medications, past family history, past medical history, past social history, past surgical history and problem list.  ? ?Objective:  ? ?Vitals:  ? 03/03/21 1118  ?BP: 115/80  ?Pulse: 92  ?Weight: 170 lb 8 oz (77.3 kg)  ? ? ?Fetal Status: Fetal Heart Rate (bpm): 130 Fundal Height: 34 cm Movement: Present  Presentation: Vertex ? ?General:  Alert, oriented and cooperative. Patient is in no acute distress.  ?Skin: Skin is warm and dry. No rash noted.   ?Cardiovascular: Normal heart rate noted  ?Respiratory: Normal respiratory effort, no problems with respiration noted  ?Abdomen: Soft, gravid, appropriate for gestational age.  Pain/Pressure: Present     ?Pelvic: Cervical exam deferred        ?Extremities: Normal range of motion.  Edema: None  ?Mental Status: Normal mood and affect. Normal behavior. Normal judgment and thought content.  ? ?Assessment and Plan:  ?Pregnancy: [id number] at [redacted]w[redacted]d ?1. Multigravida of advanced maternal age in third trimester ?LR NIPT ?Last growth at 60% ? ?2. Supervision of high risk pregnancy, antepartum ?Continue prenatal care. ?IOL scheduled at 41 wks - orders placed ? ?3. Gastroesophageal reflux in pregnancy ? ? ?4. Language barrier ?Arabic interpreter: Denice Paradise used ? ? ?Term labor symptoms and general obstetric precautions including but  not limited to vaginal bleeding, contractions, leaking of fluid and fetal movement were reviewed in detail with the patient. ?Please refer to After Visit Summary for other counseling recommendations.  ? ?Return in 1 week (on 03/10/2021). ? ?No future appointments. ? ?Donnamae Jude, MD ? ?

## 2021-03-03 NOTE — Patient Instructions (Signed)
[facility] ? ? ?Address: [address] [place], [place], [place] [address] ? ?

## 2021-03-05 ENCOUNTER — Inpatient Hospital Stay (HOSPITAL_COMMUNITY): Payer: Medicaid Other | Admitting: Anesthesiology

## 2021-03-05 ENCOUNTER — Encounter (HOSPITAL_COMMUNITY): Payer: Self-pay | Admitting: Obstetrics and Gynecology

## 2021-03-05 ENCOUNTER — Other Ambulatory Visit: Payer: Self-pay

## 2021-03-05 ENCOUNTER — Inpatient Hospital Stay (HOSPITAL_COMMUNITY)
Admission: AD | Admit: 2021-03-05 | Discharge: 2021-03-06 | DRG: 807 | Disposition: A | Discharge status: Home / Self Care | Payer: Medicaid Other | Attending: Obstetrics and Gynecology | Admitting: Obstetrics and Gynecology

## 2021-03-05 DIAGNOSIS — R030 Elevated blood-pressure reading, without diagnosis of hypertension: Secondary | ICD-10-CM | POA: Diagnosis not present

## 2021-03-05 DIAGNOSIS — Z3A39 39 weeks gestation of pregnancy: Secondary | ICD-10-CM | POA: Diagnosis not present

## 2021-03-05 DIAGNOSIS — O26893 Other specified pregnancy related conditions, third trimester: Principal | ICD-10-CM | POA: Diagnosis present

## 2021-03-05 DIAGNOSIS — N90810 Female genital mutilation status, unspecified: Secondary | ICD-10-CM | POA: Diagnosis not present

## 2021-03-05 DIAGNOSIS — O0990 Supervision of high risk pregnancy, unspecified, unspecified trimester: Secondary | ICD-10-CM

## 2021-03-05 DIAGNOSIS — O3483 Maternal care for other abnormalities of pelvic organs, third trimester: Secondary | ICD-10-CM | POA: Diagnosis not present

## 2021-03-05 DIAGNOSIS — Z3A38 38 weeks gestation of pregnancy: Secondary | ICD-10-CM | POA: Diagnosis not present

## 2021-03-05 DIAGNOSIS — Z789 Other specified health status: Secondary | ICD-10-CM | POA: Diagnosis present

## 2021-03-05 DIAGNOSIS — Z20822 Contact with and (suspected) exposure to covid-19: Secondary | ICD-10-CM | POA: Diagnosis not present

## 2021-03-05 DIAGNOSIS — O09529 Supervision of elderly multigravida, unspecified trimester: Secondary | ICD-10-CM

## 2021-03-05 DIAGNOSIS — O3463 Maternal care for abnormality of vagina, third trimester: Secondary | ICD-10-CM | POA: Diagnosis not present

## 2021-03-05 LAB — CBC
HCT: 37.8 % (ref 36.0–46.0)
Hemoglobin: 12.2 g/dL (ref 12.0–15.0)
MCH: 28.9 pg (ref 26.0–34.0)
MCHC: 32.3 g/dL (ref 30.0–36.0)
MCV: 89.6 fL (ref 80.0–100.0)
Platelets: 119 10*3/uL — ABNORMAL LOW (ref 150–400)
RBC: 4.22 MIL/uL (ref 3.87–5.11)
RDW: 16.3 % — ABNORMAL HIGH (ref 11.5–15.5)
WBC: 8.1 10*3/uL (ref 4.0–10.5)
nRBC: 0 % (ref 0.0–0.2)

## 2021-03-05 LAB — TYPE AND SCREEN
ABO/RH(D): B POS
Antibody Screen: NEGATIVE

## 2021-03-05 LAB — RPR: RPR Ser Ql: NONREACTIVE

## 2021-03-05 LAB — COMPREHENSIVE METABOLIC PANEL
ALT: 19 U/L (ref 0–44)
AST: 29 U/L (ref 15–41)
Albumin: 2.9 g/dL — ABNORMAL LOW (ref 3.5–5.0)
Alkaline Phosphatase: 93 U/L (ref 38–126)
Anion gap: 9 (ref 5–15)
BUN: 9 mg/dL (ref 6–20)
CO2: 20 mmol/L — ABNORMAL LOW (ref 22–32)
Calcium: 9.3 mg/dL (ref 8.9–10.3)
Chloride: 106 mmol/L (ref 98–111)
Creatinine, Ser: 0.74 mg/dL (ref 0.44–1.00)
GFR, Estimated: >60 mL/min (ref >60)
Glucose, Bld: 85 mg/dL (ref 70–99)
Potassium: 3.9 mmol/L (ref 3.5–5.1)
Sodium: 135 mmol/L (ref 135–145)
Total Bilirubin: 0.4 mg/dL (ref 0.3–1.2)
Total Protein: 6.1 g/dL — ABNORMAL LOW (ref 6.5–8.1)

## 2021-03-05 LAB — RESP PANEL BY RT-PCR (FLU A&B, COVID) ARPGX2
Influenza A by PCR: NEGATIVE
Influenza B by PCR: NEGATIVE
SARS Coronavirus 2 by RT PCR: NEGATIVE

## 2021-03-05 MED ORDER — ACETAMINOPHEN 325 MG PO TABS: 650.0000 mg | ORAL_TABLET | ORAL | Status: DC | PRN

## 2021-03-05 MED ORDER — DOCUSATE SODIUM 100 MG PO CAPS
100.0000 mg | ORAL_CAPSULE | Freq: Two times a day (BID) | ORAL | Status: DC
  Administered 2021-03-05 – 2021-03-06 (×2): 100 mg via ORAL
  Filled 2021-03-05 (×2): qty 1

## 2021-03-05 MED ORDER — PRENATAL MULTIVITAMIN CH
1.0000 | ORAL_TABLET | Freq: Every day | ORAL | Status: DC
  Administered 2021-03-06: 1 via ORAL
  Filled 2021-03-05: qty 1

## 2021-03-05 MED ORDER — OXYCODONE-ACETAMINOPHEN 5-325 MG PO TABS: 2.0000 | ORAL_TABLET | ORAL | Status: DC | PRN

## 2021-03-05 MED ORDER — PHENYLEPHRINE 40 MCG/ML (10ML) SYRINGE FOR IV PUSH (FOR BLOOD PRESSURE SUPPORT): 80.0000 ug | PREFILLED_SYRINGE | INTRAVENOUS | Status: DC | PRN

## 2021-03-05 MED ORDER — TRANEXAMIC ACID-NACL 1000-0.7 MG/100ML-% IV SOLN: 1000.0000 mg | INTRAVENOUS | Status: DC

## 2021-03-05 MED ORDER — ONDANSETRON HCL 4 MG/2ML IJ SOLN: 4.0000 mg | Freq: Four times a day (QID) | INTRAMUSCULAR | Status: DC | PRN

## 2021-03-05 MED ORDER — LACTATED RINGERS IV SOLN: 500.0000 mL | Freq: Once | INTRAVENOUS | Status: DC

## 2021-03-05 MED ORDER — IBUPROFEN 600 MG PO TABS
600.0000 mg | ORAL_TABLET | Freq: Four times a day (QID) | ORAL | Status: DC
  Administered 2021-03-05 – 2021-03-06 (×3): 600 mg via ORAL
  Filled 2021-03-05 (×4): qty 1

## 2021-03-05 MED ORDER — TERBUTALINE SULFATE 1 MG/ML IJ SOLN: 0.2500 mg | Freq: Once | INTRAMUSCULAR | Status: DC | PRN

## 2021-03-05 MED ORDER — ONDANSETRON HCL 4 MG PO TABS: 4.0000 mg | ORAL_TABLET | ORAL | Status: DC | PRN

## 2021-03-05 MED ORDER — LIDOCAINE HCL (PF) 1 % IJ SOLN
30.0000 mL | INTRAMUSCULAR | Status: AC | PRN
  Administered 2021-03-05: 30 mL via SUBCUTANEOUS
  Filled 2021-03-05: qty 30

## 2021-03-05 MED ORDER — DIBUCAINE (PERIANAL) 1 % EX OINT: 1.0000 "application " | TOPICAL_OINTMENT | CUTANEOUS | Status: DC | PRN

## 2021-03-05 MED ORDER — ONDANSETRON HCL 4 MG/2ML IJ SOLN: 4.0000 mg | INTRAMUSCULAR | Status: DC | PRN

## 2021-03-05 MED ORDER — SOD CITRATE-CITRIC ACID 500-334 MG/5ML PO SOLN: 30.0000 mL | ORAL | Status: DC | PRN

## 2021-03-05 MED ORDER — FLEET ENEMA 7-19 GM/118ML RE ENEM: 1.0000 | ENEMA | RECTAL | Status: DC | PRN

## 2021-03-05 MED ORDER — LACTATED RINGERS IV SOLN: INTRAVENOUS | Status: DC

## 2021-03-05 MED ORDER — TRANEXAMIC ACID-NACL 1000-0.7 MG/100ML-% IV SOLN
INTRAVENOUS | Status: AC
  Administered 2021-03-05: 1000 mg via INTRAVENOUS
  Filled 2021-03-05: qty 100

## 2021-03-05 MED ORDER — SIMETHICONE 80 MG PO CHEW
80.0000 mg | CHEWABLE_TABLET | ORAL | Status: DC | PRN
Start: 2021-03-05 — End: 2021-03-06

## 2021-03-05 MED ORDER — BENZOCAINE-MENTHOL 20-0.5 % EX AERO
1.0000 "application " | INHALATION_SPRAY | CUTANEOUS | Status: DC | PRN
  Administered 2021-03-06: 1 via TOPICAL
  Filled 2021-03-05: qty 56

## 2021-03-05 MED ORDER — OXYCODONE-ACETAMINOPHEN 5-325 MG PO TABS: 1.0000 | ORAL_TABLET | ORAL | Status: DC | PRN

## 2021-03-05 MED ORDER — SENNOSIDES-DOCUSATE SODIUM 8.6-50 MG PO TABS
2.0000 | ORAL_TABLET | Freq: Every day | ORAL | Status: DC
  Administered 2021-03-06: 2 via ORAL
  Filled 2021-03-05: qty 2

## 2021-03-05 MED ORDER — OXYTOCIN BOLUS FROM INFUSION
333.0000 mL | Freq: Once | INTRAVENOUS | Status: AC
  Administered 2021-03-05: 333 mL via INTRAVENOUS

## 2021-03-05 MED ORDER — FENTANYL-BUPIVACAINE-NACL 0.5-0.125-0.9 MG/250ML-% EP SOLN
12.0000 mL/h | EPIDURAL | Status: DC | PRN
  Administered 2021-03-05: 12 mL/h via EPIDURAL
  Filled 2021-03-05: qty 250

## 2021-03-05 MED ORDER — EPHEDRINE 5 MG/ML INJ: 10.0000 mg | INTRAVENOUS | Status: DC | PRN

## 2021-03-05 MED ORDER — LACTATED RINGERS IV SOLN: 500.0000 mL | INTRAVENOUS | Status: DC | PRN

## 2021-03-05 MED ORDER — DIPHENHYDRAMINE HCL 50 MG/ML IJ SOLN: 12.5000 mg | INTRAMUSCULAR | Status: DC | PRN

## 2021-03-05 MED ORDER — ZOLPIDEM TARTRATE 5 MG PO TABS: 5.0000 mg | ORAL_TABLET | Freq: Every evening | ORAL | Status: DC | PRN

## 2021-03-05 MED ORDER — DIPHENHYDRAMINE HCL 25 MG PO CAPS: 25.0000 mg | ORAL_CAPSULE | Freq: Four times a day (QID) | ORAL | Status: DC | PRN

## 2021-03-05 MED ORDER — WITCH HAZEL-GLYCERIN EX PADS: 1.0000 "application " | MEDICATED_PAD | CUTANEOUS | Status: DC | PRN

## 2021-03-05 MED ORDER — COCONUT OIL OIL: 1.0000 "application " | TOPICAL_OIL | Status: DC | PRN

## 2021-03-05 MED ORDER — OXYTOCIN-SODIUM CHLORIDE 30-0.9 UT/500ML-% IV SOLN
2.5000 [IU]/h | INTRAVENOUS | Status: DC
  Filled 2021-03-05: qty 500

## 2021-03-05 MED ORDER — MISOPROSTOL 25 MCG QUARTER TABLET: 25.0000 ug | ORAL_TABLET | ORAL | Status: DC | PRN

## 2021-03-05 MED ORDER — LIDOCAINE HCL (PF) 1 % IJ SOLN
INTRAMUSCULAR | Status: DC | PRN
  Administered 2021-03-05 (×2): 4 mL via EPIDURAL

## 2021-03-05 MED ORDER — TETANUS-DIPHTH-ACELL PERTUSSIS 5-2.5-18.5 LF-MCG/0.5 IM SUSY: 0.5000 mL | PREFILLED_SYRINGE | Freq: Once | INTRAMUSCULAR | Status: DC

## 2021-03-05 NOTE — H&P (Signed)
OBSTETRIC ADMISSION HISTORY AND PHYSICAL ? ?Amanda George is a 36 y.o. female [phone] with IUP at [redacted]w[redacted]d by LMP presenting for spontaneous labor. She reports +FMs, No LOF, no VB, no blurry vision, headaches or peripheral edema, and RUQ pain.  She plans on Breast feeding. She requests birth control. ?She received her prenatal care at [facility]  ? ?Dating: By LMP --->  Estimated Date of Delivery: 03/10/21 ? ?Sono:   ? ?@[redacted]w[redacted]d , CWD, normal anatomy, cephalic3 presentation, [id number], 60% EFW ? ? ?Nursing Staff Provider  ?Office Location [facility] Dating  LMP  ?Language  Arabic; [place] Anatomy US  Normal  ?Flu Vaccine  11/10/20 Genetic/Carrier Screen  NIPS:   Low risk ?AFP:   Not done ?Horizon: neg 4/4  ?TDaP Vaccine  12/15/20 Hgb A1C or  ?GTT Early - normal ?Third trimester - normal 72/160/124  ?COVID Vaccine 2 doses    LAB RESULTS   ?Rhogam  n/a Blood Type   B positive  ?Baby Feeding Plan Breast Antibody  Negative  ?Contraception Depo Rubella  Immune: 7.26   ?Circumcision No RPR  Non-reactive  ?Pediatrician  [facility] HBsAg   Negative  ?Support Person FOB HCVAb neg  ?Prenatal Classes  HIV   Negative  ?BTL Consent NA GBS   ?(For PCN allergy, check sensitivities)   ?VBAC Consent NA Pap NILM, neg HPV 02/15/2018  ?     ?DME Rx [ ]  BP cuff ?[ ]  Weight Scale Waterbirth  [ ]  Class [ ]  Consent [ ]  CNM visit  ?PHQ9 & GAD7 [x]  new OB ?[x]  28 weeks  ?[x]  36 weeks Induction  [ ]  Orders Entered [ ] Foley Y/N  ? ?Prenatal History/Complications: AMA, hx female circumcision ? ?Past Medical History: ?Past Medical History:  ?Diagnosis Date  ? Medical history non-contributory   ? ? ?Past Surgical History: ?Past Surgical History:  ?Procedure Laterality Date  ? NO PAST SURGERIES    ? ? ?Obstetrical History: ?OB History   ? ? Gravida  ?4  ? Para  ?3  ? Term  ?3  ? Preterm  ?   ? AB  ?   ? Living  ?3  ?  ? ? SAB  ?   ? IAB  ?   ? Ectopic  ?   ? Multiple  ?0  ? Live Births  ?3  ?   ?  ?  ? ? ?Social History ?Social History  ? ?Socioeconomic History  ? Marital  status: Married  ?  Spouse name: Salena Saner  ? Number of children: 2  ? Years of education: Not on file  ? Highest education level: 12th grade  ?Occupational History  ? Not on file  ?Tobacco Use  ? Smoking status: Never  ? Smokeless tobacco: Never  ?Vaping Use  ? Vaping Use: Never used  ?Substance and Sexual Activity  ? Alcohol use: Not Currently  ? Drug use: Never  ? Sexual activity: Yes  ?  Birth control/protection: Pill  ?Other Topics Concern  ? Not on file  ?Social History Narrative  ? Not on file  ? ?Social Determinants of Health  ? ?Financial Resource Strain: Not on file  ?Food Insecurity: No Food Insecurity  ? Worried About Charity fundraiser in the Last Year: Never true  ? Ran Out of Food in the Last Year: Never true  ?Transportation Needs: No Transportation Needs  ? Lack of Transportation (Medical): No  ? Lack of Transportation (Non-Medical): No  ?Physical Activity: Not on file  ?  Stress: Not on file  ?Social Connections: Not on file  ? ? ?Family History: ?Family History  ?Problem Relation Age of Onset  ? Healthy Mother   ? Healthy Father   ? ? ?Allergies: ?No Known Allergies ? ?Medications Prior to Admission  ?Medication Sig Dispense Refill Last Dose  ? docusate sodium (COLACE) 100 MG capsule Take 1 capsule (100 mg total) by mouth 2 (two) times daily. 10 capsule 0   ? polyethylene glycol (MIRALAX) 17 g packet Take 17 g by mouth daily. 14 each 0   ? Prenatal Vit-Fe Fumarate-FA (PRENATAL COMPLETE) 14-0.4 MG TABS Take 1 tablet by mouth daily. 60 tablet 2   ? ?Review of Systems  ? ?All systems reviewed and negative except as stated in HPI ? ?Blood pressure (!) 130/91, pulse 97, temperature 98.7 ?F (37.1 ?C), temperature source Oral, resp. rate 17, last menstrual period 06/03/2020, SpO2 100 %, currently breastfeeding. ?General appearance: alert, cooperative, and mild distress ?Lungs: clear to auscultation bilaterally ?Heart: regular rate and rhythm ?Abdomen: soft, non-tender; bowel sounds normal ?Pelvic:  n/a ?Extremities: Homans sign is negative, no sign of DVT ?DTR's +2 ?Presentation: cephalic ?Fetal monitoringBaseline: 120 bpm, Variability: Good {> 6 bpm), Accelerations: Reactive, and Decelerations: Absent ?Uterine activityFrequency: Every 2-3 minutes ?Dilation: 5 ?Effacement (%): 70 ?Station: -3 ?Exam by:: Cloretta Ned, RN ? ? ?Prenatal labs: ?ABO, Rh: B/Positive/-- (11/09 0945) ?Antibody: Negative (11/09 0945) ?Rubella: 7.26 (11/09 0945) ?RPR: Non Reactive (11/09 0945)  ?HBsAg: Negative (11/09 0945)  ?HIV: Non Reactive (11/09 0945)  ?GBS: Negative/-- (02/14 1151)  ? ?Prenatal Transfer Tool  ?Maternal Diabetes: No ?Genetic Screening: Normal ?Maternal Ultrasounds/Referrals: Normal ?Fetal Ultrasounds or other Referrals:  Referred to [facility]  ?Maternal Substance Abuse:  No ?Significant Maternal Medications:  None ?Significant Maternal Lab Results: Group B Strep negative ? ?No results found for this or any previous visit (from the past 24 hour(s)). ? ?Patient Active Problem List  ? Diagnosis Date Noted  ? AMA (advanced maternal age) multigravida 35+ 01/14/2021  ? Gastroesophageal reflux in pregnancy 12/30/2020  ? Supervision of high risk pregnancy, antepartum 11/10/2020  ? 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 Language barrier 02/01/2018  ? ? ?Assessment/Plan:  ?Amanda George is a 36 y.o. [phone] at [redacted]w[redacted]d here for spontaneous onset of labor ? ?#Labor: expectant management for now ? ?Initial BPs elevated on arrival but patient writhing in pain. Preeclampsia labs ordered. Patient denies HA, visual changes or epigastric pain ? ?#Pain: Planning epidural ?#FWB: Cat 1 ?#ID:  GBS neg ?#MOF: Breast ?#MOC: planning depo ?#Circ:  no ? ?Wende Mott, CNM  ?03/05/2021, 8:55 AM ? ?

## 2021-03-05 NOTE — Lactation Note (Signed)
This note was copied from a baby's chart. ?Lactation Consultation Note ? ?Patient Name: Amanda George ?Today's Date: 03/05/2021 ?Reason for consult: L&D Initial assessment ?Age:35 hours ?Consult was done with  Arabic interpreter, Zaid: ? ?L&D consult with <60 minutes old infant and P4 mother. Congratulated family on newborn.  ?Baby is latched upon arrival. Noted audible sucks, flanged lips and consistent sucking.   ? ?Discussed STS as ideal transition for infants after birth. Talked about primal reflexes. Explained LC services availability during postpartum stay. Thanked family for their time.   ? ?Maternal Data ?Has patient been taught Hand Expression?: Yes ?Does the patient have breastfeeding experience prior to this delivery?: Yes ?How long did the patient breastfeed?: >12 months x4 ? ?Feeding ?Mother's Current Feeding Choice: Breast Milk ? ?LATCH Score ?Latch: Grasps breast easily, tongue down, lips flanged, rhythmical sucking. ? ?Audible Swallowing: Spontaneous and intermittent ? ?Type of Nipple: Everted at rest and after stimulation ? ?Comfort (Breast/Nipple): Soft / non-tender ? ?Hold (Positioning): No assistance needed to correctly position infant at breast. ? ?LATCH Score: 10 ? ?Interventions ?Interventions: Breast feeding basics reviewed;Expressed milk;Education;Hand express ? ?Consult Status ?Consult Status: Follow-up from L&D ?Date: 03/05/21 ? ? ? ?Vala Raffo A Higuera Ancidey ?03/05/2021, 3:59 PM ? ? ? ?

## 2021-03-05 NOTE — Anesthesia Preprocedure Evaluation (Addendum)
Anesthesia Evaluation  Patient identified by MRN, date of birth, ID band Patient awake    Reviewed: Allergy & Precautions, Patient's Chart, lab work & pertinent test results  History of Anesthesia Complications Negative for: history of anesthetic complications  Airway Mallampati: II  TM Distance: >3 FB Neck ROM: Full    Dental no notable dental hx.    Pulmonary neg pulmonary ROS,    Pulmonary exam normal        Cardiovascular negative cardio ROS Normal cardiovascular exam     Neuro/Psych negative neurological ROS  negative psych ROS   GI/Hepatic negative GI ROS, Neg liver ROS,   Endo/Other  negative endocrine ROS  Renal/GU negative Renal ROS  negative genitourinary   Musculoskeletal negative musculoskeletal ROS (+)   Abdominal   Peds  Hematology negative hematology ROS (+)   Anesthesia Other Findings Day of surgery medications reviewed with patient.  Reproductive/Obstetrics (+) Pregnancy                             Anesthesia Physical Anesthesia Plan  ASA: 2  Anesthesia Plan: Epidural   Post-op Pain Management:    Induction:   PONV Risk Score and Plan: Treatment may vary due to age or medical condition  Airway Management Planned: Natural Airway  Additional Equipment: Fetal Monitoring  Intra-op Plan:   Post-operative Plan:   Informed Consent: I have reviewed the patients History and Physical, chart, labs and discussed the procedure including the risks, benefits and alternatives for the proposed anesthesia with the patient or authorized representative who has indicated his/her understanding and acceptance.     Interpreter used for interveiw  Plan Discussed with:   Anesthesia Plan Comments:         Anesthesia Quick Evaluation  

## 2021-03-05 NOTE — Progress Notes (Signed)
Amanda George is a 36 y.o. G4P3003 at [redacted]w[redacted]d by LMP admitted for SOL. ? ?[employer] used for encounter.  ? ?Subjective: ?Pt doing well.  Husband is 1 hour away eating with plans to return.  Pt asks to defer AROM until he returns.  ? ?Objective: ?Upon entrance, pt resting in left lateral position.  NAD. ? ?BP 116/77   Pulse 73   Temp 98.8 ?F (37.1 ?C) (Oral)   Resp 18   Ht 5\' 4"  (1.626 m)   Wt 77.1 kg   LMP 06/03/2020 (Exact Date)   SpO2 100%   BMI 29.18 kg/m?  ?No intake/output data recorded. ?No intake/output data recorded. ? ?FHT:  FHR: 120's bpm, variability: moderate,  accelerations:  Abscent,  decelerations:  Absent ?UC:   irregular and adjusted ?SVE:   Dilation: 7.5 ?Effacement (%): 70 ?Station: -2 ?Exam by:: [email or web address] SNM ? ?Labs: ?Lab Results  ?Component Value Date  ? WBC 8.1 03/05/2021  ? HGB 12.2 03/05/2021  ? HCT 37.8 03/05/2021  ? MCV 89.6 03/05/2021  ? PLT 119 (L) 03/05/2021  ? ? ?Assessment / Plan: ?Spontaneous labor, progressing normally.Planning AROM once husband returns.  ? ?Fetal Wellbeing:  Category I ?Pain Control:  Epidural ?I/D:   GBS negative ?Anticipated MOD:  NSVD ? ?05/05/2021 Tyquarius Paglia ?03/05/2021, 2:07 PM ? ? ?

## 2021-03-05 NOTE — Discharge Summary (Addendum)
? ?  Postpartum Discharge Summary ?*[employer] Video Arabic Interpreter, Has [phone] used for entire visit - verified and verbal consent given by patient to use female interpreter ?   ?Patient Name: Amanda George ?DOB: 09/03/1985 ?MRN: [id number] ? ?Date of admission: 03/05/2021 ?Delivery date:03/05/2021  ?Delivering provider: WHITE, LAKASHA  ?Date of discharge: 03/06/2021 ? ?Admitting diagnosis: Post term pregnancy [O48.0] ?Intrauterine pregnancy: [redacted]w[redacted]d    ?Secondary diagnosis:  Active Problems: ?  Language barrier ?  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  Supervision of high risk pregnancy, antepartum ?  AMA (advanced maternal age) multigravida 3109+? ?Additional problems: none    ?Discharge diagnosis: Term Pregnancy Delivered                                              ?Post partum procedures: none ?Augmentation: AROM ?Complications: None ? ?Hospital course: Onset of Labor With Vaginal Delivery      ?36y.o. yo G4P3003 at 349w2das admitted in Active Labor on 03/05/2021. Patient had an uncomplicated labor course as follows:  ?Membrane Rupture Time/Date: 2:20 PM ,03/05/2021   ?Delivery Method:Vaginal, Spontaneous  ?Episiotomy: Median  ?Lacerations:  2nd degree;1st degree;Perineal  ?Patient had an uncomplicated postpartum course.  She is ambulating, tolerating a regular diet, passing flatus, and urinating well. Patient is discharged home in stable condition on 03/06/21. ? ?Newborn Data: ?Birth date:03/05/2021  ?Birth time:3:02 PM  ?Gender:Female  ?Living status:Living  ?Apgars:8 ,9  ?Weight:3220 g  ? ?Magnesium Sulfate received: No ?BMZ received: No ?Rhophylac:N/A ?MMR:N/A ?T-DaP:Given prenatally 12/15/20 ?Flu: Yes - 11/10/20 ?Transfusion:No ? ?Physical exam  ?Vitals:  ? 03/05/21 1811 03/05/21 2146 03/06/21 0227 03/06/21 0544  ?BP: 104/67 117/74 103/72 109/79  ?Pulse: 71 75 70 76  ?Resp: _0 ?Temp: 98.6 ?F (37 ?C) 98.8 ?F (37.1 ?C) 98.6 ?F (37 ?C) 98.4 ?F (36.9 ?C)  ?TempSrc: Oral Oral Oral Oral  ?SpO2:      ?Weight:       ?Height:      ? ?General: alert, cooperative, and no distress ?Lochia: appropriate ?Uterine Fundus: firm, U-1 ?Incision: N/A ?DVT Evaluation: No evidence of DVT seen on physical exam. ?Negative Homan's sign. ?No cords or calf tenderness. ?No significant calf/ankle edema. ?Labs: ?Lab Results  ?Component Value Date  ? WBC 9.8 03/06/2021  ? HGB 11.1 (L) 03/06/2021  ? HCT 34.2 (L) 03/06/2021  ? MCV 90.2 03/06/2021  ? PLT 104 (L) 03/06/2021  ? ?CMP Latest Ref Rng & Units 03/05/2021  ?Glucose 70 - 99 mg/dL 85  ?BUN 6 - 20 mg/dL 9  ?Creatinine 0.44 - 1.00 mg/dL 0.74  ?Sodium 135 - 145 mmol/L 135  ?Potassium 3.5 - 5.1 mmol/L 3.9  ?Chloride 98 - 111 mmol/L 106  ?CO2 22 - 32 mmol/L 20(L)  ?Calcium 8.9 - 10.3 mg/dL 9.3  ?Total Protein 6.5 - 8.1 g/dL 6.1(L)  ?Total Bilirubin 0.3 - 1.2 mg/dL 0.4  ?Alkaline Phos 38 - 126 U/L 93  ?AST 15 - 41 U/L 29  ?ALT 0 - 44 U/L 19  ? ?Edinburgh Score: ?Edinburgh Postnatal Depression Scale Screening Tool 03/06/2021  ?I have been able to laugh and see the funny side of things. 0  ?I have looked forward with enjoyment to things. 3  ?I have blamed myself unnecessarily when things went wrong. 0  ?I have been anxious or worried for no  good reason. 0  ?I have felt scared or panicky for no good reason. 0  ?Things have been getting on top of me. 2  ?I have been so unhappy that I have had difficulty sleeping. 0  ?I have felt sad or miserable. 0  ?I have been so unhappy that I have been crying. 0  ?The thought of harming myself has occurred to me. 0  ?Edinburgh Postnatal Depression Scale Total 5  ? ? ? ? ?After visit meds:  ?Allergies as of 03/06/2021   ?No Known Allergies ?  ? ?  ?Medication List  ?  ? ?TAKE these medications   ? ?docusate sodium 100 MG capsule ?Commonly known as: Colace ?Take 1 capsule (100 mg total) by mouth 2 (two) times daily. ?  ?ibuprofen 600 MG tablet ?Commonly known as: ADVIL ?Take 1 tablet (600 mg total) by mouth every 6 (six) hours. ?  ?polyethylene glycol 17 g packet ?Commonly  known as: MiraLax ?Take 17 g by mouth daily. ?  ?Prenatal Complete 14-0.4 MG Tabs ?Take 1 tablet by mouth daily. ?  ? ?  ? ? ? ?Discharge home in stable condition ?Infant Feeding: Breast ?Infant Disposition:home with mother ?Discharge instruction: per After Visit Summary and Postpartum booklet. ?Activity: Advance as tolerated. Pelvic rest for 6 weeks.  ?Diet: routine diet ?Anticipated Birth Control: Depo ?Postpartum Appointment:6 weeks ?Additional Postpartum F/U:  None ?Future Appointments: ?Future Appointments  ?Date Time Provider [place]  ?03/11/2021 10:15 AM Griffin Basil, MD [facility]  ? ?Follow up Visit: ? Follow-up Information   ? ? Center for [employer] at [facility]. Schedule an appointment as soon as possible for a visit in 1 month(s).   ?Specialty: Obstetrics and Gynecology ?Why: postpartum visit ?Contact information: ?[address] ?[place] [id number] ?[phone] ? ?  ?  ? ?  ?  ? ?  ? ? ?Laury Deep, CNM  ?03/06/2021  ?  ? ? ?

## 2021-03-05 NOTE — Anesthesia Procedure Notes (Signed)
Epidural ?Patient location during procedure: OB ?Start time: 03/05/2021 11:05 AM ?End time: 03/05/2021 11:08 AM ? ?Staffing ?Anesthesiologist: Brennan Bailey, MD ?Performed: anesthesiologist  ? ?Preanesthetic Checklist ?Completed: patient identified, IV checked, risks and benefits discussed, monitors and equipment checked, pre-op evaluation and timeout performed ? ?Epidural ?Patient position: sitting ?Prep: DuraPrep and site prepped and draped ?Patient monitoring: continuous pulse ox, blood pressure and heart rate ?Approach: midline ?Location: L3-L4 ?Injection technique: LOR air ? ?Needle:  ?Needle type: Tuohy  ?Needle gauge: 17 G ?Needle length: 9 cm ?Needle insertion depth: 5 cm ?Catheter type: closed end flexible ?Catheter size: 19 Gauge ?Catheter at skin depth: 10 cm ?Test dose: negative and Other (1% lidocaine) ? ?Assessment ?Events: blood not aspirated, injection not painful, no injection resistance, no paresthesia and negative IV test ? ?Additional Notes ?Patient identified. Risks, benefits, and alternatives discussed with patient including but not limited to bleeding, infection, nerve damage, paralysis, failed block, incomplete pain control, headache, blood pressure changes, nausea, vomiting, reactions to medication, itching, and postpartum back pain. Confirmed with bedside nurse the patient's most recent platelet count. Confirmed with patient that they are not currently taking any anticoagulation, have any bleeding history, or any family history of bleeding disorders. Patient expressed understanding and wished to proceed. All questions were answered. Sterile technique was used throughout the entire procedure. Please see nursing notes for vital signs.  ? ?Crisp LOR on first pass. Test dose was given through epidural catheter and negative prior to continuing to dose epidural or start infusion. Warning signs of high block given to the patient including shortness of breath, tingling/numbness in hands, complete  motor block, or any concerning symptoms with instructions to call for help. Patient was given instructions on fall risk and not to get out of bed. All questions and concerns addressed with instructions to call with any issues or inadequate analgesia.  Reason for block:procedure for pain ? ? ? ?

## 2021-03-05 NOTE — MAU Note (Signed)
Patient arrived to MAU complaining of contractions that started at 6am. Patient denies vaginal bleeding and or leakage of fluid. Patient reports DFM. ?

## 2021-03-06 LAB — CBC
HCT: 34.2 % — ABNORMAL LOW (ref 36.0–46.0)
Hemoglobin: 11.1 g/dL — ABNORMAL LOW (ref 12.0–15.0)
MCH: 29.3 pg (ref 26.0–34.0)
MCHC: 32.5 g/dL (ref 30.0–36.0)
MCV: 90.2 fL (ref 80.0–100.0)
Platelets: 104 10*3/uL — ABNORMAL LOW (ref 150–400)
RBC: 3.79 MIL/uL — ABNORMAL LOW (ref 3.87–5.11)
RDW: 16.5 % — ABNORMAL HIGH (ref 11.5–15.5)
WBC: 9.8 10*3/uL (ref 4.0–10.5)
nRBC: 0 % (ref 0.0–0.2)

## 2021-03-06 MED ORDER — IBUPROFEN 600 MG PO TABS: 600.0000 mg | ORAL_TABLET | Freq: Four times a day (QID) | ORAL | 0 refills | Status: DC

## 2021-03-06 NOTE — Progress Notes (Signed)
Pt given discharge instructions with Interpreter # [id number]. Pt verbalized understanding. Pt d/c stable. ?

## 2021-03-06 NOTE — Lactation Note (Signed)
This note was copied from a baby's chart. ?Lactation Consultation Note ? ?Patient Name: Amanda George ?Today's Date: 03/06/2021 ?Reason for consult: Initial assessment;Term ?Age:35 hours ? ?LC in to visit with P4 Mom of term baby.  Baby has had numerous breastfeeds since birth.  Baby at 2% weight loss with adequate output. Mom had told her nurse that she wanted to give baby formula because "there is no milk".  Baby wouldn't bottle feed. ? ?Baby sleepy.  Reviewed hand expression and a large flow of colostrum noted.  LC tried to spoon feed baby, he stuck his tongue out to lap up the colostrum and then fell asleep again.  Baby has also been spitting a bit.  Reassured Mom and encouraged STS with baby to help baby digest, stay warm, and stimulate feedings.  ? ?Hand pump given to Mom as she doesn't have a pump at home.  Demonstrated use.  ? ?Baby sleeping swaddled in crib currently.  ?Mom aware of IP and OP lactation support available to her and encouraged her to ask for help prn. ? ? ?Lactation Tools Discussed/Used ?Tools: Pump ?Breast pump type: Manual ?Reason for Pumping: PRN ? ?Interventions ?Interventions: Skin to skin;Breast massage;Hand express;LC Services brochure;Hand pump;Education ? ?Consult Status ?Consult Status: Follow-up ?Date: 03/07/21 ?Follow-up type: In-patient ? ? ? ?Johny Blamer E ?03/06/2021, 9:00 AM ? ? ? ?

## 2021-03-06 NOTE — Social Work (Signed)
CSW consulted for transportation.  ? ?CSW met with MOB to assess and offer support. CSW observed FOB 'Saber' also bedside. CSW utilized [employer] interpreter [place] #[id number]. CSW introduced self and role. CSW congratulated MOB and inquired on her current mood. MOB reported she is currently doing good. CSW inquired on transportation for infant's follow-up appointment. FOB reported that he is a truck driver and not always home to help with appointments. FOB stated they will have transportation at discharge, but may need assistance to infant's pediatric appointment. Parents stated the appointment has not yet been scheduled. CSW inquired on supports in the area. FOB shared they have family and friends who could possibly be helpful if needed. CSW informed parents that once an appointment has been made, [facility] transportation will be contacted to inquire on pre-scheduling an appointment. CSW inquired on a car seat and safe sleep space, parents reported they have all infant essentials. Parents reported no additional needs at this time.  ? ?CSW spoke with pediatrician to provide an update. An appointment may be able to be scheduled when FOB available to transport.  ? ?CSW available to assist with transportation as needed.  ? ?Nikeshia Keetch, LCSW ?Clinical Social Work ?[facility] ?[phone] ? ?

## 2021-03-06 NOTE — Anesthesia Postprocedure Evaluation (Signed)
Anesthesia Post Note ? ?Patient: Amanda George ? ?Procedure(s) Performed: AN AD HOC LABOR EPIDURAL ? ?  ? ?Patient location during evaluation: Mother Baby ?Anesthesia Type: Epidural ?Level of consciousness: awake and alert ?Pain management: pain level controlled ?Vital Signs Assessment: post-procedure vital signs reviewed and stable ?Respiratory status: spontaneous breathing, nonlabored ventilation and respiratory function stable ?Cardiovascular status: stable ?Postop Assessment: no headache, no backache, epidural receding, patient able to bend at knees, able to ambulate, adequate PO intake and no apparent nausea or vomiting ?Anesthetic complications: no ? ? ?No notable events documented. ? ?Last Vitals:  ?Vitals:  ? 03/06/21 0227 03/06/21 0544  ?BP: 103/72 109/79  ?Pulse: 70 76  ?Resp: 18 18  ?Temp: 37 ?C 36.9 ?C  ?SpO2:    ?  ?Last Pain:  ?Vitals:  ? 03/06/21 0827  ?TempSrc:   ?PainSc: 0-No pain  ? ?Pain Goal: Patients Stated Pain Goal: 0 (03/05/21 0846) ? ?  ?  ?  ?  ?  ?  ?Epidural/Spinal Function Cutaneous sensation: Normal sensation (03/06/21 0827), Patient able to flex knees: Yes (03/06/21 0827), Patient able to lift hips off bed: Yes (03/06/21 0827), Back pain beyond tenderness at insertion site: No (03/06/21 0827), Progressively worsening motor and/or sensory loss: No (03/06/21 0827), Bowel and/or bladder incontinence post epidural: No (03/06/21 0827) ? ?Blayn Whetsell Hristova ? ? ? ? ?

## 2021-03-07 ENCOUNTER — Telehealth: Payer: Self-pay

## 2021-03-07 DIAGNOSIS — Z9189 Other specified personal risk factors, not elsewhere classified: Secondary | ICD-10-CM

## 2021-03-07 NOTE — Telephone Encounter (Signed)
Transition Care Management Follow-up Telephone Call ?Date of discharge and from where: 03/06/2021-[facility] ?How have you been since you were released from the hospital? Pt is doing fine.  ?Any questions or concerns? No ? ?Items Reviewed: ?Did the pt receive and understand the discharge instructions provided? Yes  ?Medications obtained and verified? Yes  ?Other? No  ?Any new allergies since your discharge? No  ?Dietary orders reviewed? No ?Do you have support at home? Yes  ? ?Home Care and Equipment/Supplies: ?Were home health services ordered? not applicable ?If so, what is the name of the agency? N/A  ?Has the agency set up a time to come to the patient's home? not applicable ?Were any new equipment or medical supplies ordered?  No ?What is the name of the medical supply agency? N/A ?Were you able to get the supplies/equipment? not applicable ?Do you have any questions related to the use of the equipment or supplies? No ? ?Functional Questionnaire: (I = Independent and D = Dependent) ?ADLs: I ? ?Bathing/Dressing- I ? ?Meal Prep- I ? ?Eating- I ? ?Maintaining continence- I ? ?Transferring/Ambulation- I ? ?Managing Meds- I ? ?Follow up appointments reviewed: ? ?PCP Hospital f/u appt confirmed? No   ?Specialist Hospital f/u appt confirmed? Yes  Scheduled to see OBGYN on 04/18/2021 @ 3:15PM. ?Are transportation arrangements needed? No  ?If their condition worsens, is the pt aware to call PCP or go to the Emergency Dept.? Yes ?Was the patient provided with contact information for the PCP's office or ED? Yes ?Was to pt encouraged to call back with questions or concerns? Yes  ?

## 2021-03-09 ENCOUNTER — Telehealth: Payer: Self-pay

## 2021-03-09 NOTE — Telephone Encounter (Signed)
? ?  Telephone encounter was:  Unsuccessful.  03/09/2021 ?Name: Amanda George MRN: [id number] DOB: [birth year] ? ?Unsuccessful outbound call made today to assist with:   PCP ? ?Outreach Attempt:  1st Attempt ? ?A HIPAA compliant voice message was left requesting a return call.  Instructed patient to call back at earliest convenience. ?. ? ? ? ?Lenard Forth ?Care Guide, Embedded Care Coordination ?[place], Care Management  ?[phone] ?[address] [place], [place], [place] [address] ?Phone: [phone] ?Email: [email or web address] ? ?  ?

## 2021-03-10 ENCOUNTER — Telehealth: Payer: Self-pay

## 2021-03-10 NOTE — Telephone Encounter (Signed)
? ?  Telephone encounter was:  Successful.  ?03/10/2021 ?Name: Amanda George MRN: [id number] DOB: [birth year] ? ?Amanda George is a 36 y.o. year old female who is a primary care patient of Patient, No Pcp Per (Inactive) . The community resource team was consulted for assistance with  pcp ? ?Care guide performed the following interventions: Patient provided with information about care guide support team and interviewed to confirm resource needs. Patients contact number is not correct, calling spouse. Spouse explained to patient how to contact medicaid to find a pcp ? ?Follow Up Plan:  No further follow up planned at this time. The patient has been provided with needed resources. ? ? ? ?Larena Sox ?Care Guide, Embedded Care Coordination ?[place], Care Management  ?[phone] ?[address], [place], [place] [address] ?Phone: [phone] ?Email: [email or web address] ? ?  ?

## 2021-03-11 ENCOUNTER — Encounter: Payer: Medicaid Other | Admitting: Obstetrics and Gynecology

## 2021-03-15 ENCOUNTER — Telehealth (HOSPITAL_COMMUNITY): Payer: Self-pay

## 2021-03-15 NOTE — Telephone Encounter (Addendum)
RN asked patient how she is doing? "If she has any questions or concerns about her healing." Patient states "Praise be to god." Patient does not voice any questions or concerns about her healing.  ? ?"Baby is good. Baby's lips are dry." RN asks patient if baby is drinking/feeding well. "Baby is feeding well. Baby was seen by the pediatrician on 03/08/21. I missed the appointment on the 10th because I didn't have a car." RN verified that patient has the right phone number for pediatrician office. Patient states [facility] to [facility]. "I tried to call and could not get ahold of anyone. I left a message." RN told patient to contact the pediatrician office again and make an appointment for baby. RN told patient that close follow up for baby is important. No other concerns or questions about baby. ? ?RN called [facility] and informed them that mother is trying to contact them to schedule an appointment for baby. Pediatrician office states they will call her.  ? ?EPDS score is 4.  ? ?Marcelino Duster Munachimso Rigdon,RN3,MSN,RNC-MNN ?03/15/2021,1513 ?

## 2021-03-17 ENCOUNTER — Inpatient Hospital Stay (HOSPITAL_COMMUNITY): Admission: AD | Admit: 2021-03-17 | Payer: Medicaid Other | Source: Home / Self Care | Admitting: Family Medicine

## 2021-03-17 ENCOUNTER — Inpatient Hospital Stay (HOSPITAL_COMMUNITY): Payer: Medicaid Other

## 2021-04-18 ENCOUNTER — Encounter: Payer: Self-pay | Admitting: Family Medicine

## 2021-04-18 ENCOUNTER — Ambulatory Visit (INDEPENDENT_AMBULATORY_CARE_PROVIDER_SITE_OTHER): Payer: Medicaid Other | Admitting: Family Medicine

## 2021-04-18 DIAGNOSIS — Z3042 Encounter for surveillance of injectable contraceptive: Secondary | ICD-10-CM

## 2021-04-18 MED ORDER — MEDROXYPROGESTERONE ACETATE 150 MG/ML IM SUSP
150.0000 mg | Freq: Once | INTRAMUSCULAR | Status: AC
  Administered 2021-04-18: 150 mg via INTRAMUSCULAR

## 2021-04-18 NOTE — Addendum Note (Signed)
Addended byVidal Schwalbe on: 04/18/2021 04:21 PM ? ? Modules accepted: Orders ? ?

## 2021-04-18 NOTE — Progress Notes (Signed)
? ? ?Post Partum Visit Note ? ?Amanda George is a 36 y.o. G24P4004 female who presents for a postpartum visit. She is 6 weeks 2 days postpartum following a normal spontaneous vaginal delivery.  I have fully reviewed the prenatal and intrapartum course. The delivery was at 39w 2d gestational weeks.  Anesthesia: epidural and local. Postpartum course has been . Baby is doing well. Baby is feeding by breast. Bleeding staining only. Bowel function is normal. Bladder function is normal. Patient is not sexually active. Contraception method is Depo-Provera injections. Postpartum depression screening: negative. ? ? ?The pregnancy intention screening data noted above was reviewed. Potential methods of contraception were discussed. The patient elected to proceed with No data recorded. ? ? Edinburgh Postnatal Depression Scale - 04/18/21 1539   ? ?  ? Edinburgh Postnatal Depression Scale:  In the Past 7 Days  ? I have been able to laugh and see the funny side of things. 0   ? I have looked forward with enjoyment to things. 0   ? I have blamed myself unnecessarily when things went wrong. 0   ? I have been anxious or worried for no good reason. 0   ? I have felt scared or panicky for no good reason. 0   ? Things have been getting on top of me. 0   ? I have been so unhappy that I have had difficulty sleeping. 0   ? I have felt sad or miserable. 0   ? I have been so unhappy that I have been crying. 0   ? The thought of harming myself has occurred to me. 0   ? Edinburgh Postnatal Depression Scale Total 0   ? ?  ?  ? ?  ? ? ? ?Health Maintenance Due  ?Topic Date Due  ? COVID-19 Vaccine (3 - Booster for Pfizer series) 10/21/2019  ? PAP SMEAR-Modifier  02/15/2021  ? ? ?The following portions of the patient's history were reviewed and updated as appropriate: allergies, current medications, past family history, past medical history, past social history, past surgical history, and problem list. ? ?Review of Systems ?Pertinent items noted in  HPI and remainder of comprehensive ROS otherwise negative. ? ?Objective:  ?BP 115/80   Pulse 77   Wt 172 lb 13.5 oz (78.4 kg)   LMP 06/03/2020 (Exact Date)   Breastfeeding Yes   BMI 29.67 kg/m?   ? ?General:  alert, cooperative, and appears stated age  ? Breasts:  not indicated  ?Lungs: Normal effort  ?Heart:  regular rate and rhythm  ?Abdomen: soft, non-tender; bowel sounds normal; no masses,  no organomegaly   ?     ?Assessment:  ? ?Normal postpartum exam.  ? ?Plan:  ? ?Essential components of care per ACOG recommendations: ? ?1.  Mood and well being: Patient with negative depression screening today. Reviewed local resources for support.  ?- Patient tobacco use? No.   ?- hx of drug use? No.   ? ?2. Infant care and feeding:  ?-Patient currently breastmilk feeding? Yes. Reviewed importance of draining breast regularly to support lactation.  ?-Social determinants of health (SDOH) reviewed in EPIC. No concerns ? ?3. Sexuality, contraception and birth spacing ?- Patient does not want a pregnancy in the next year.  Desired family size is 4 children.  ?- Reviewed reproductive life planning. Reviewed contraceptive methods based on pt preferences and effectiveness.  Patient desired Hormonal Injection today.   ?- Discussed birth spacing of 18 months ? ?4. Sleep and  fatigue ?-Encouraged family/partner/community support of 4 hrs of uninterrupted sleep to help with mood and fatigue ? ?5. Physical Recovery  ?- Discussed patients delivery and complications. She describes her labor as good. ?- Patient had a Vaginal, no problems at delivery. Patient had a 2nd degree laceration. Perineal healing reviewed. Patient expressed understanding ?- Patient has urinary incontinence? No. ?- Patient is safe to resume physical and sexual activity ? ?6.  Health Maintenance ?- HM due items addressed Yes ?- Last pap smear  ?Diagnosis  ?Date Value Ref Range Status  ?02/15/2018   Final  ? NEGATIVE FOR INTRAEPITHELIAL LESIONS OR MALIGNANCY.  ?  Pap smear not done at today's visit.  ?-Breast Cancer screening indicated? No.  ? ?7. Chronic Disease/Pregnancy Condition follow up: None ?- PCP follow up ? ?Reva Bores, MD ?Center for [facility], [facility] ? ?

## 2021-10-19 ENCOUNTER — Other Ambulatory Visit: Payer: Self-pay

## 2021-10-19 ENCOUNTER — Ambulatory Visit: Payer: Medicaid Other | Admitting: Obstetrics & Gynecology

## 2021-10-19 ENCOUNTER — Encounter: Payer: Self-pay | Admitting: Obstetrics & Gynecology

## 2021-10-19 ENCOUNTER — Other Ambulatory Visit (HOSPITAL_COMMUNITY)
Admission: RE | Admit: 2021-10-19 | Discharge: 2021-10-19 | Disposition: A | Discharge status: Home / Self Care | Payer: Medicaid Other | Source: Ambulatory Visit | Attending: Family Medicine | Admitting: Family Medicine

## 2021-10-19 VITALS — BP 118/84 | HR 82 | Ht 62.0 in | Wt 194.0 lb

## 2021-10-19 DIAGNOSIS — Z01419 Encounter for gynecological examination (general) (routine) without abnormal findings: Secondary | ICD-10-CM

## 2021-10-19 DIAGNOSIS — Z30017 Encounter for initial prescription of implantable subdermal contraceptive: Secondary | ICD-10-CM

## 2021-10-19 LAB — POCT PREGNANCY, URINE: Preg Test, Ur: NEGATIVE

## 2021-10-19 MED ORDER — ETONOGESTREL 68 MG ~~LOC~~ IMPL
68.0000 mg | DRUG_IMPLANT | Freq: Once | SUBCUTANEOUS | Status: AC
  Administered 2021-10-19: 68 mg via SUBCUTANEOUS

## 2021-10-19 NOTE — Progress Notes (Signed)
GYNECOLOGY ANNUAL PREVENTATIVE CARE ENCOUNTER NOTE  History:     Amanda George is a 36 y.o. G3P4004 female here for a routine annual gynecologic exam. Patient is Arabic-speaking only, interpreter present for this encounter. Current complaints: none.  Desires Nexplanon for contraception.   Denies abnormal vaginal bleeding, discharge, pelvic pain, problems with intercourse or other gynecologic concerns.    Gynecologic History No LMP recorded. (Menstrual status: Lactating). Contraception: condoms Last Pap: 02/15/2018. Result was normal with negative HPV.   Obstetric History OB History  Gravida Para Term Preterm AB Living  4 4 4     4   SAB IAB Ectopic Multiple Live Births        0 4    # Outcome Date GA Lbr Len/2nd Weight Sex Delivery Anes PTL Lv  4 Term 03/05/21 [redacted]w[redacted]d 06:26 / 00:36 7 lb 1.6 oz (3.22 kg) M Vag-Spont Spinal, Local  LIV  3 Term 04/12/18 [redacted]w[redacted]d 13:00 / 00:08 6 lb 7.7 oz (2.94 kg) F Vag-Spont EPI  LIV  2 Term 04/15/15 [redacted]w[redacted]d   F Vag-Spont  N LIV     Birth Comments: Nuchal and body cord - delayed breathing and crying  1 Term 03/23/12 [redacted]w[redacted]d   M Vag-Spont  N LIV     Birth Comments: Nuchal cord @ birth, delayed breathing and crying    Past Medical History:  Diagnosis Date   Medical history non-contributory     Past Surgical History:  Procedure Laterality Date   NO PAST SURGERIES      No current outpatient medications on file prior to visit.   No current facility-administered medications on file prior to visit.    No Known Allergies  Social History:  reports that she has never smoked. She has never been exposed to tobacco smoke. She has never used smokeless tobacco. She reports that she does not currently use alcohol. She reports that she does not use drugs.  Family History  Problem Relation Age of Onset   Healthy Mother    Healthy Father     The following portions of the patient's history were reviewed and updated as appropriate: allergies, current medications,  past family history, past medical history, past social history, past surgical history and problem list.  Review of Systems Pertinent items noted in HPI and remainder of comprehensive ROS otherwise negative.  Physical Exam:  BP 118/84   Pulse 82   Ht 5\' 2"  (1.575 m)   Wt 194 lb (88 kg)   Breastfeeding Yes   BMI 35.48 kg/m  CONSTITUTIONAL: Well-developed, well-nourished female in no acute distress.  HENT:  Normocephalic, atraumatic, External right and left ear normal.  EYES: Conjunctivae and EOM are normal. Pupils are equal, round, and reactive to light. No scleral icterus.  NECK: Normal range of motion, supple, no masses.  Normal thyroid.  SKIN: Skin is warm and dry. No rash noted. Not diaphoretic. No erythema. No pallor. MUSCULOSKELETAL: Normal range of motion. No tenderness.  No cyanosis, clubbing, or edema. NEUROLOGIC: Alert and oriented to person, place, and time. Normal reflexes, muscle tone coordination.  PSYCHIATRIC: Normal mood and affect. Normal behavior. Normal judgment and thought content. CARDIOVASCULAR: Normal heart rate noted, regular rhythm RESPIRATORY: Clear to auscultation bilaterally. Effort and breath sounds normal, no problems with respiration noted. BREASTS: Symmetric in size. No masses, tenderness, skin changes, nipple drainage, or lymphadenopathy bilaterally. Performed in the presence of a chaperone. ABDOMEN: Soft, no distention noted.  No tenderness, rebound or guarding.  PELVIC: Normal appearing external genitalia  and urethral meatus; normal appearing vaginal mucosa and cervix.  No abnormal vaginal discharge noted.  Pap smear obtained.  Normal uterine size, no other palpable masses, no uterine or adnexal tenderness.  Performed in the presence of a chaperone.   Nexplanon Insertion Procedure Patient identified, informed consent performed, consent signed.   Patient does understand that irregular bleeding is a very common side effect of this medication. She was  advised to have backup contraception for one week after placement. Pregnancy test in clinic today was negative.  Appropriate time out taken.  Patient's left arm was prepped and draped in the usual sterile fashion. The ruler used to measure and mark insertion area.  Patient was prepped with alcohol swab and then injected with 3 ml of 1% lidocaine.  She was prepped with betadine, Nexplanon removed from packaging,  Device confirmed in needle, then inserted full length of needle and withdrawn per handbook instructions. Nexplanon was able to palpated in the patient's arm; patient palpated the insert herself. There was minimal blood loss.  Patient insertion site covered with guaze and a pressure bandage to reduce any bruising.  The patient tolerated the procedure well and was given post procedure instructions.     Assessment and Plan:    1. Nexplanon insertion Nexplanon can be in place for up to four years.  - etonogestrel (NEXPLANON) implant 68 mg  2. Well woman exam with routine gynecological exam - Cytology - PAP Will follow up results of pap smear and manage accordingly. Routine preventative health maintenance measures emphasized. Please refer to After Visit Summary for other counseling recommendations.      Jaynie Collins, MD, FACOG Obstetrician & Gynecologist, [facility] for [employer], [facility]

## 2021-10-19 NOTE — Patient Instructions (Signed)
Nexplanon Instructions After Insertion  Keep bandage clean and dry for 24 hours  May use ice/Tylenol/Ibuprofen for soreness or pain  If you develop fever, drainage or increased warmth from incision site-contact office immediately   

## 2021-10-24 LAB — CYTOLOGY - PAP
Comment: NEGATIVE
Diagnosis: NEGATIVE
High risk HPV: NEGATIVE

## 2022-01-03 DIAGNOSIS — J019 Acute sinusitis, unspecified: Secondary | ICD-10-CM | POA: Diagnosis not present

## 2022-01-03 DIAGNOSIS — K219 Gastro-esophageal reflux disease without esophagitis: Secondary | ICD-10-CM | POA: Diagnosis not present

## 2022-09-21 DIAGNOSIS — S93402A Sprain of unspecified ligament of left ankle, initial encounter: Secondary | ICD-10-CM | POA: Diagnosis not present

## 2022-09-21 DIAGNOSIS — S60222A Contusion of left hand, initial encounter: Secondary | ICD-10-CM | POA: Diagnosis not present

## 2022-12-14 DIAGNOSIS — K219 Gastro-esophageal reflux disease without esophagitis: Secondary | ICD-10-CM | POA: Diagnosis not present

## 2022-12-14 DIAGNOSIS — J00 Acute nasopharyngitis [common cold]: Secondary | ICD-10-CM | POA: Diagnosis not present

## 2023-01-23 DIAGNOSIS — J111 Influenza due to unidentified influenza virus with other respiratory manifestations: Secondary | ICD-10-CM | POA: Diagnosis not present

## 2023-03-02 DIAGNOSIS — N938 Other specified abnormal uterine and vaginal bleeding: Secondary | ICD-10-CM | POA: Diagnosis not present

## 2023-03-02 DIAGNOSIS — R42 Dizziness and giddiness: Secondary | ICD-10-CM | POA: Diagnosis not present

## 2023-03-02 DIAGNOSIS — R600 Localized edema: Secondary | ICD-10-CM | POA: Diagnosis not present
